# Patient Record
Sex: Female | Born: 1985 | Race: White | Hispanic: No | Marital: Married | Smoking: Former smoker
Health system: Southern US, Community
[De-identification: ages and names within clinical notes are randomized; demographics above are authoritative.]

## PROBLEM LIST (undated history)

## (undated) DIAGNOSIS — R7303 Prediabetes: Secondary | ICD-10-CM

## (undated) DIAGNOSIS — F419 Anxiety disorder, unspecified: Secondary | ICD-10-CM

## (undated) DIAGNOSIS — D649 Anemia, unspecified: Secondary | ICD-10-CM

## (undated) DIAGNOSIS — A749 Chlamydial infection, unspecified: Secondary | ICD-10-CM

## (undated) DIAGNOSIS — F329 Major depressive disorder, single episode, unspecified: Secondary | ICD-10-CM

## (undated) DIAGNOSIS — B977 Papillomavirus as the cause of diseases classified elsewhere: Secondary | ICD-10-CM

## (undated) DIAGNOSIS — R51 Headache: Secondary | ICD-10-CM

## (undated) DIAGNOSIS — F32A Depression, unspecified: Secondary | ICD-10-CM

## (undated) HISTORY — PX: OTHER SURGICAL HISTORY: SHX169

## (undated) HISTORY — DX: Anxiety disorder, unspecified: F41.9

## (undated) HISTORY — DX: Prediabetes: R73.03

## (undated) HISTORY — PX: NO PAST SURGERIES: SHX2092

---

## 2006-05-31 ENCOUNTER — Other Ambulatory Visit: Admission: RE | Admit: 2006-05-31 | Discharge: 2006-05-31 | Payer: Self-pay | Admitting: Obstetrics and Gynecology

## 2006-09-24 ENCOUNTER — Inpatient Hospital Stay (HOSPITAL_COMMUNITY): Admission: AD | Admit: 2006-09-24 | Discharge: 2006-09-25 | Payer: Self-pay | Admitting: Obstetrics and Gynecology

## 2006-09-28 ENCOUNTER — Inpatient Hospital Stay (HOSPITAL_COMMUNITY): Admission: AD | Admit: 2006-09-28 | Discharge: 2006-09-29 | Payer: Self-pay | Admitting: Obstetrics and Gynecology

## 2006-10-25 ENCOUNTER — Inpatient Hospital Stay (HOSPITAL_COMMUNITY): Admission: AD | Admit: 2006-10-25 | Discharge: 2006-10-26 | Payer: Self-pay | Admitting: Obstetrics and Gynecology

## 2006-10-29 ENCOUNTER — Ambulatory Visit: Payer: Self-pay | Admitting: Cardiology

## 2006-11-06 ENCOUNTER — Inpatient Hospital Stay (HOSPITAL_COMMUNITY): Admission: AD | Admit: 2006-11-06 | Discharge: 2006-11-06 | Payer: Self-pay | Admitting: Obstetrics and Gynecology

## 2006-11-15 ENCOUNTER — Inpatient Hospital Stay (HOSPITAL_COMMUNITY): Admission: AD | Admit: 2006-11-15 | Discharge: 2006-11-17 | Payer: Self-pay | Admitting: Obstetrics and Gynecology

## 2006-11-26 ENCOUNTER — Ambulatory Visit: Admission: RE | Admit: 2006-11-26 | Discharge: 2006-11-26 | Payer: Self-pay | Admitting: Obstetrics and Gynecology

## 2006-11-30 ENCOUNTER — Ambulatory Visit: Admission: RE | Admit: 2006-11-30 | Discharge: 2006-11-30 | Payer: Self-pay | Admitting: Obstetrics and Gynecology

## 2008-01-15 ENCOUNTER — Ambulatory Visit: Payer: Self-pay | Admitting: Cardiology

## 2008-01-17 ENCOUNTER — Ambulatory Visit: Payer: Self-pay | Admitting: Cardiovascular Disease

## 2008-01-17 ENCOUNTER — Ambulatory Visit: Payer: Self-pay | Admitting: Cardiology

## 2008-02-09 IMAGING — US US OB COMP +14 WK
1 series · 13 of 27 positions shown · non-contrast
Comparison: none

CLINICAL DATA: 20-year-old female with 32 week 1 day gestation.  Preterm labor.  Evaluate fluid, growth, and cervical length.

[Series 1: us ob comp +14 wk · 0.30mm/px · 13 of 27 slices shown]
[im 2/27]
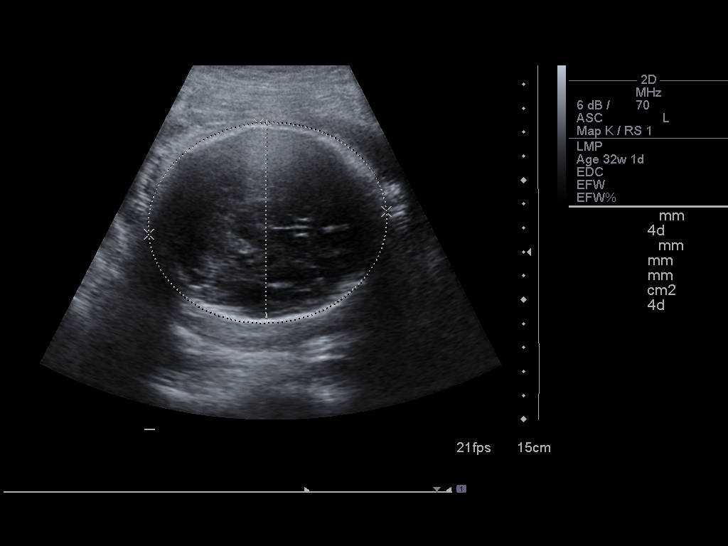
[im 4/27]
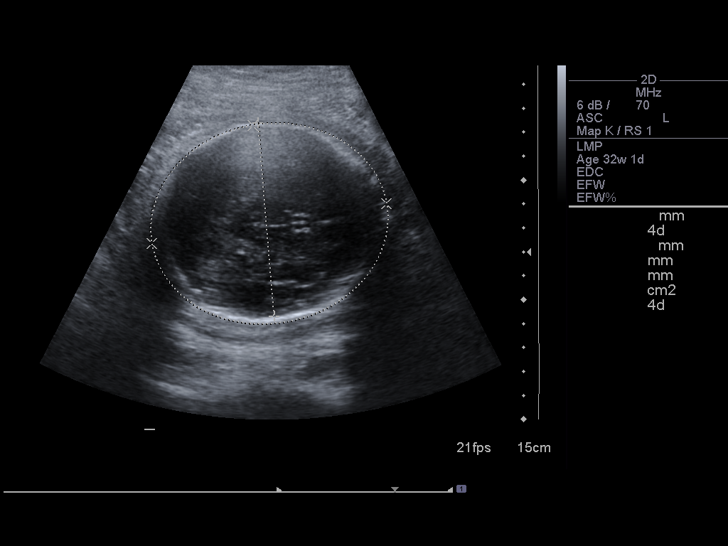
[im 6/27]
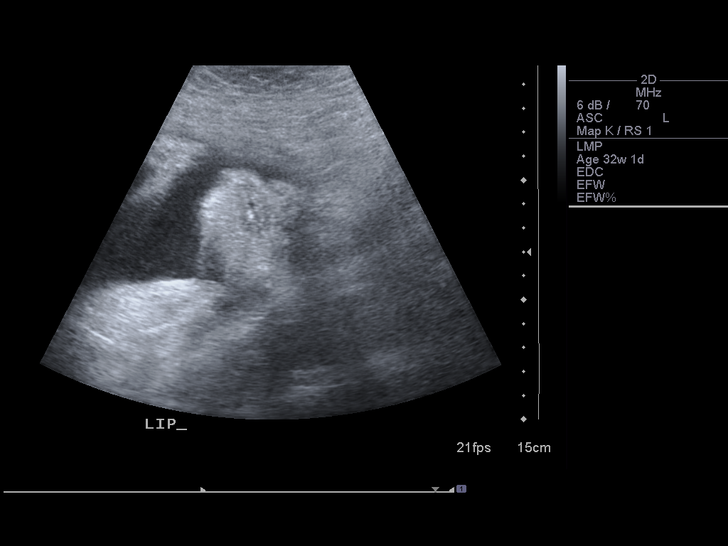
[im 8/27]
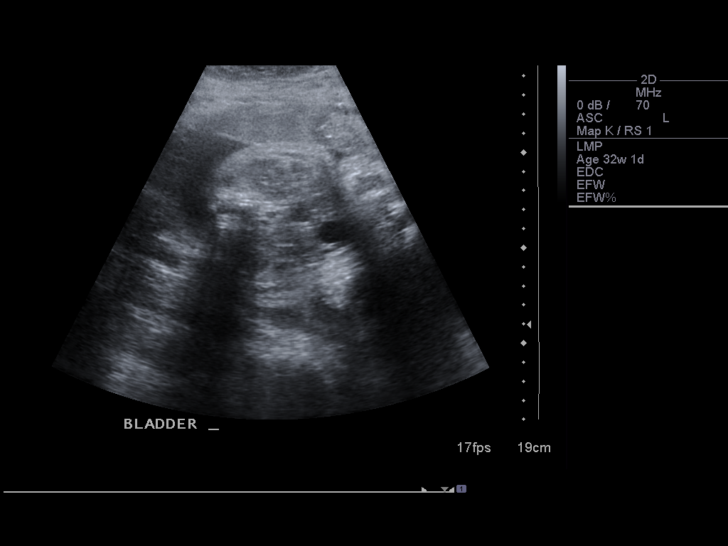
[im 10/27]
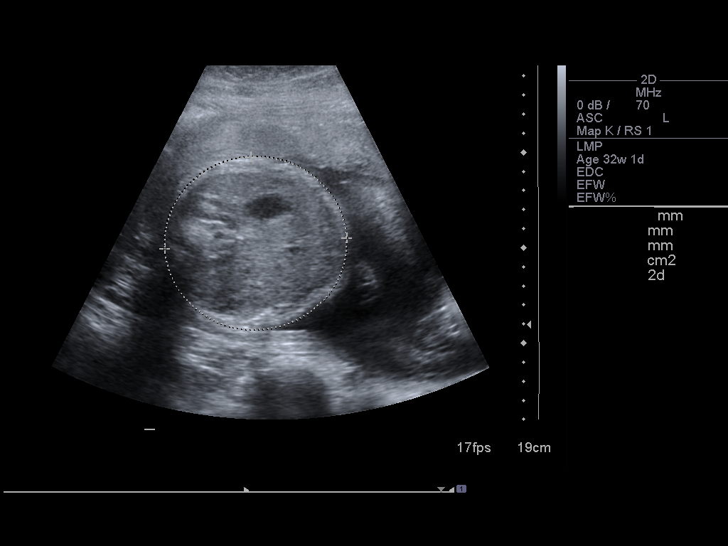
[im 12/27]
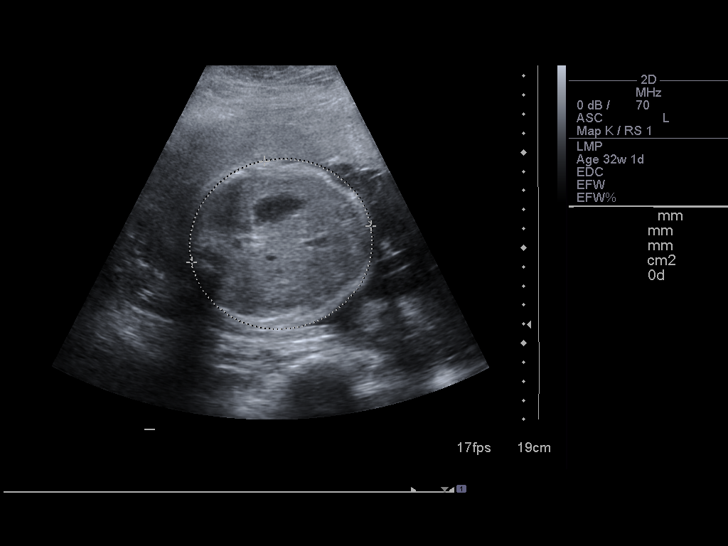
[im 14/27]
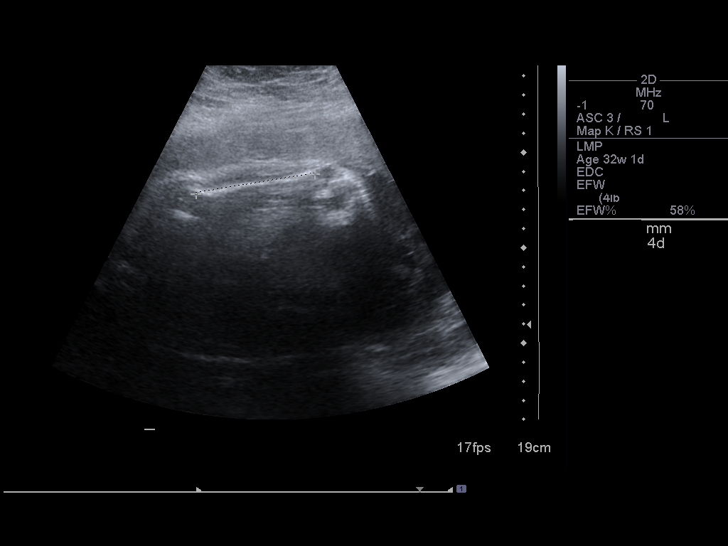
[im 16/27]
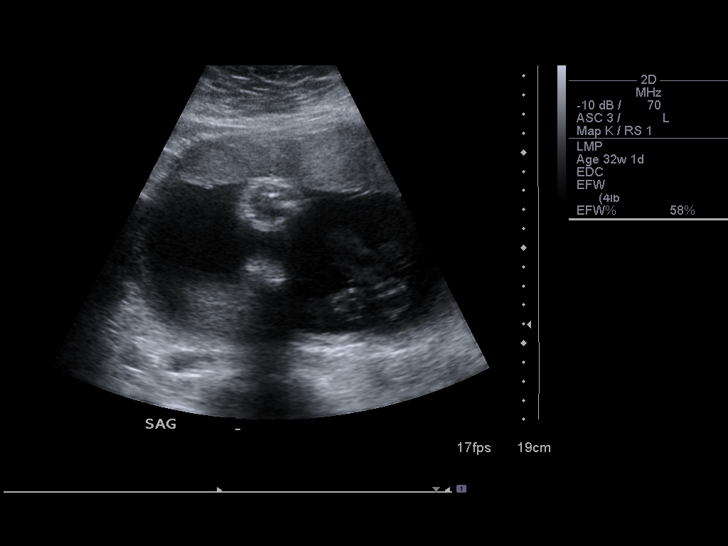
[im 18/27]
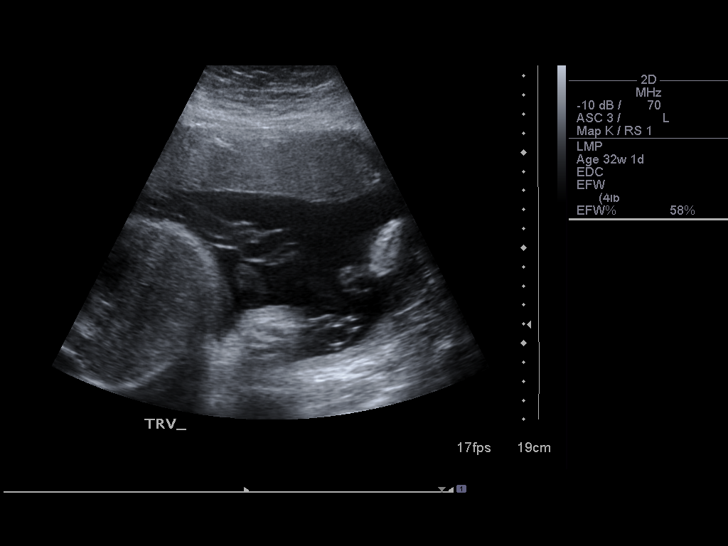
[im 20/27]
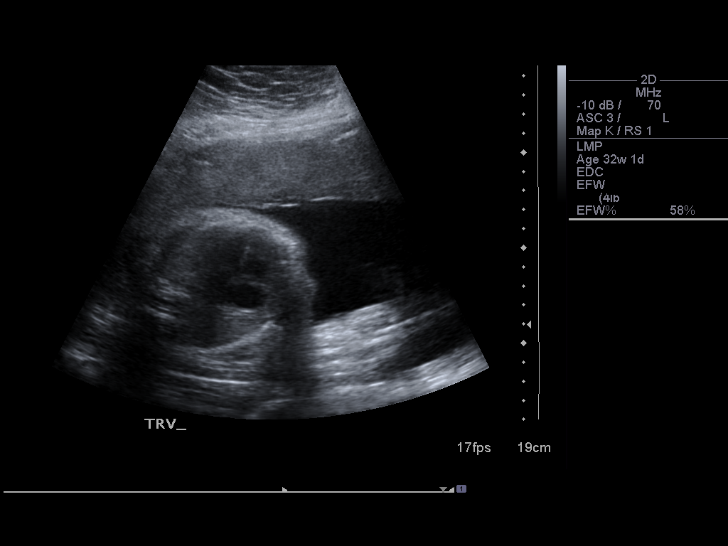
[im 22/27]
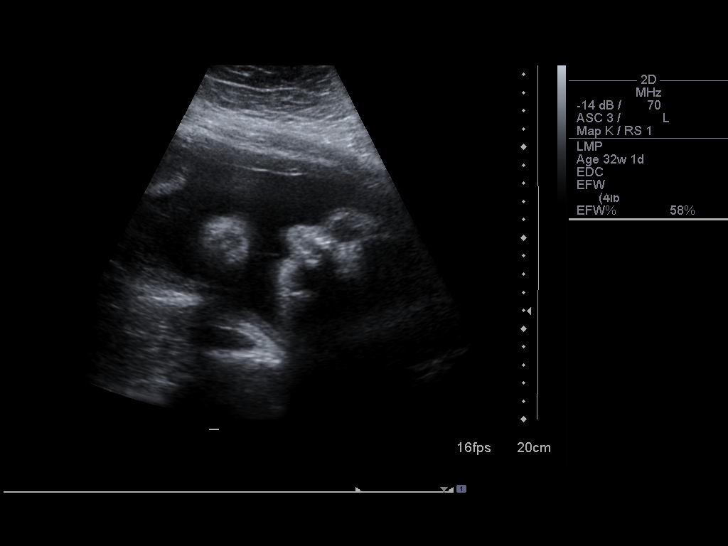
[im 24/27]
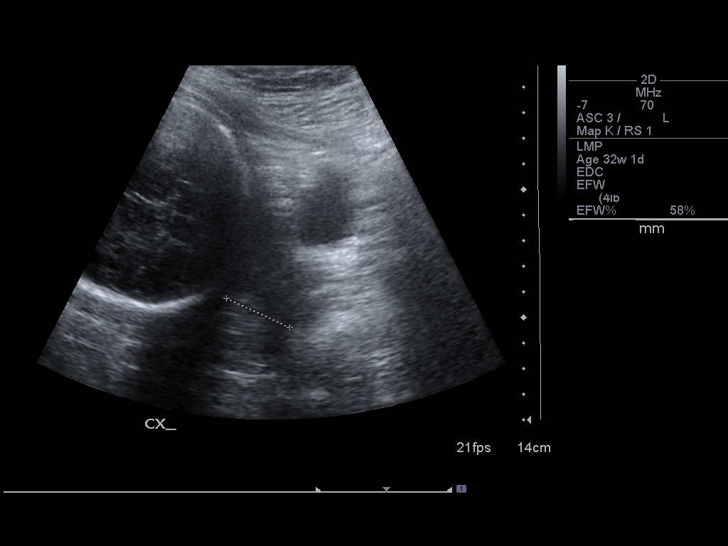
[im 26/27]
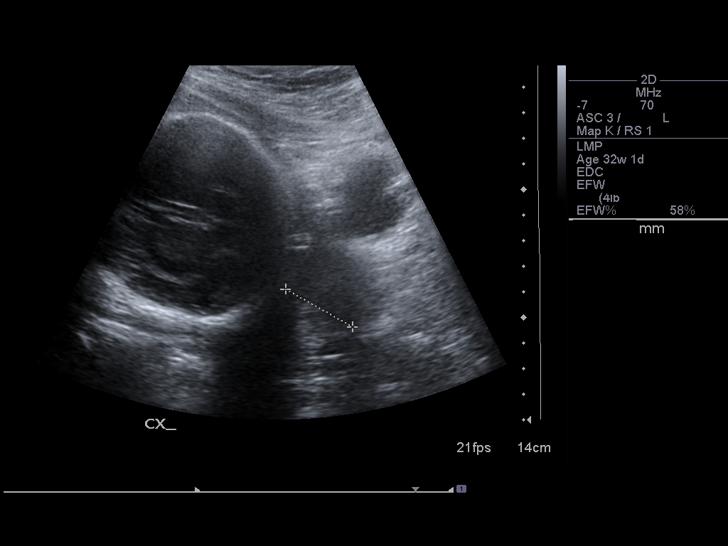

[13 of 27 positions shown; findings below may reference images not displayed]

OBSTETRICAL ULTRASOUND:
 Number of Fetuses:  1
 Heart Rate:  147 bpm
 Movement:  Yes
 Breathing:  Yes
 Presentation:  Cephalic
 Placental Location:  Anterior
 Grade:  I
 Previa:  No
 Amniotic Fluid (Subjective):  Upper limits-normal
 Amniotic Fluid (Objective):  AFI 25.0 cm (8th-38th %ile = 8.6 to 24.2 cm for 32 weeks) 

 FETAL BIOMETRY
 BPD:  8.1 cm   32 w 4 d 
 HC:  28.8 cm   31 w 4 d 
 AC:  28.9 cm  33 w 0 d 
 FL:  6.3 cm   32 w 5 d 

 MEAN GA:  32 w 3 d   US EDC:  11/16/06
 Assigned GA:  32 w 1 d   Assigned EDC:  11/18/06

 EFW:  1341 grams 50th ? 75th %ile (8486 ? 4080 g) for 32 weeks 

 FETAL ANATOMY
 Lateral Ventricles:  Visualized 
 Thalami/CSP:  Visualized 
 Posterior Fossa:  Not visualized 
 Nuchal Region:  Not visualized 
 Spine:  Not visualized 
 4 Chamber Heart on Left:  Visualized 
 Stomach on Left:  Visualized 
 3 Vessel Cord:  Not visualized 
 Cord Insertion site:  Not visualized 
 Kidneys:  Visualized 
 Bladder:  Visualized 
 Extremities:  Not visualized 

 ADDITIONAL ANATOMY VISUALIZED:  Upper lip and orbits.  

 Evaluation limited by:  Maternal habitus, fetal position, and advanced age.

 MATERNAL UTERINE AND ADNEXAL FINDINGS
 Cervix:  2.7-3.0 cm transabdominal.
IMPRESSION: 1.  Single living intrauterine gestation in cephalic presentation with assigned gestational age of 32 weeks 1 day.  Estimated gestational age by this ultrasound is 32 weeks 3 days.  
 2.  Upper limits of normal amniotic fluid volume.
 3.  Cervical length 2.7-3.0 cm.

## 2009-06-03 IMAGING — CT CT ANGIO CHEST
2 of 5 series · 19 of 46 positions shown · IV contrast (Omnipaque 300)
Comparison: 10/25/2006

Addendum Begins

Clinical history should read:  "Chest pain and shortness of breath"
Impression should read:  "No acute findings."
Addendum Ends
CLINICAL DATA: Chest pain and also be.
CT ANGIOGRAPHY CHEST
TECHNIQUE: Multidetector CT imaging of the chest using the
standard protocol during bolus administration of intravenous
contrast. Multiplanar reconstructed images obtained and reviewed to
evaluate the vascular anatomy.
Contrast: 80 ml of omni 300

[Series 6: pe_xxl 1.0 b20f st · axial · 0.80mm/px · z∈[-275,-20]mm · 16 of 287 slices shown]
[im 16/287  lung]
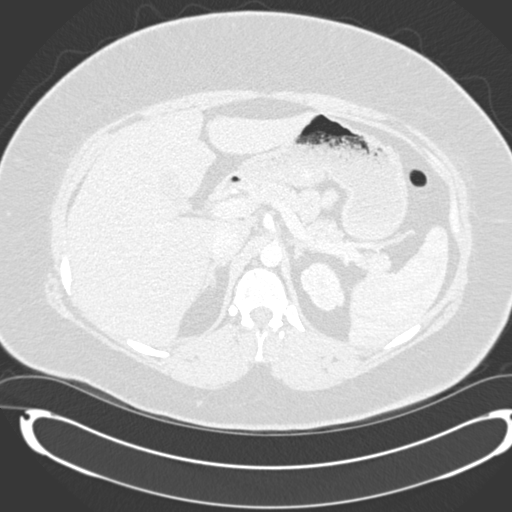
[im 32/287  soft-tissue]
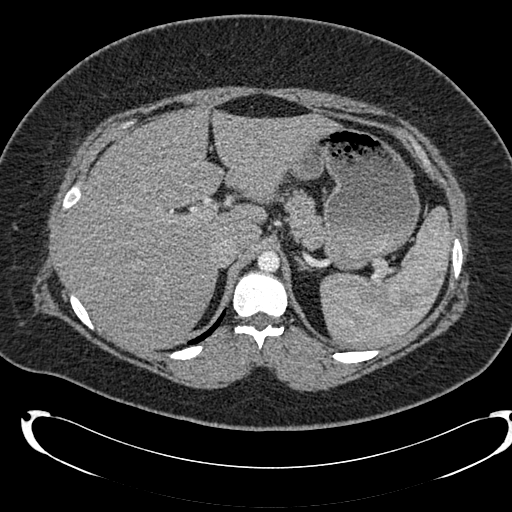
[im 48/287  lung]
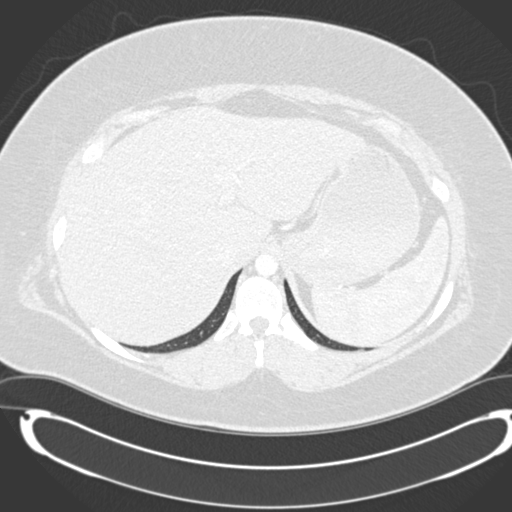
[im 64/287  soft-tissue]
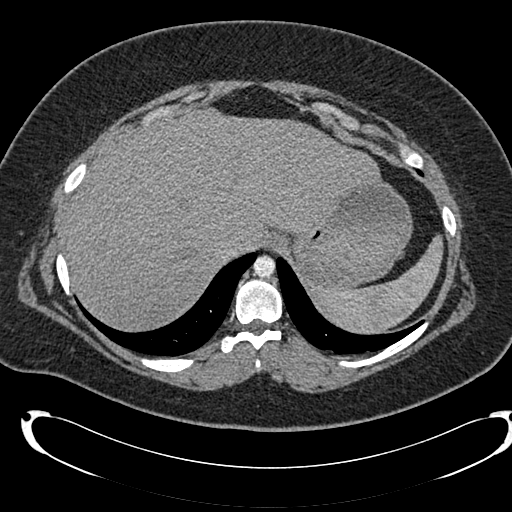
[im 80/287  lung]
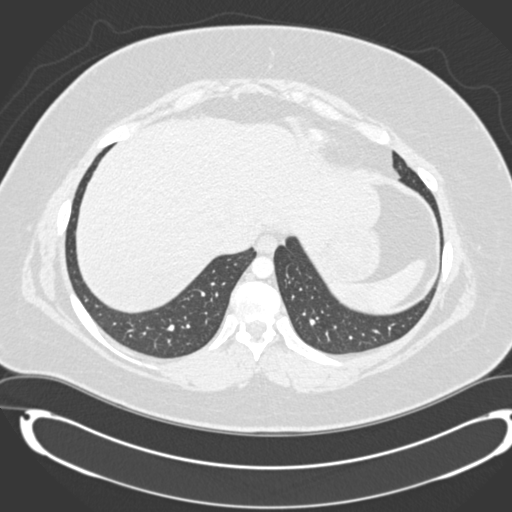
[im 96/287  soft-tissue]
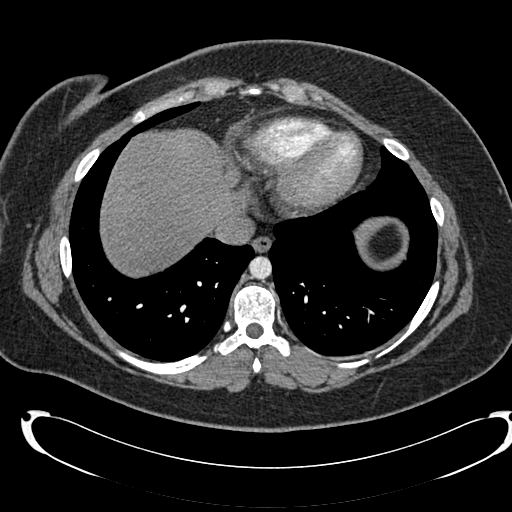
[im 112/287  lung]
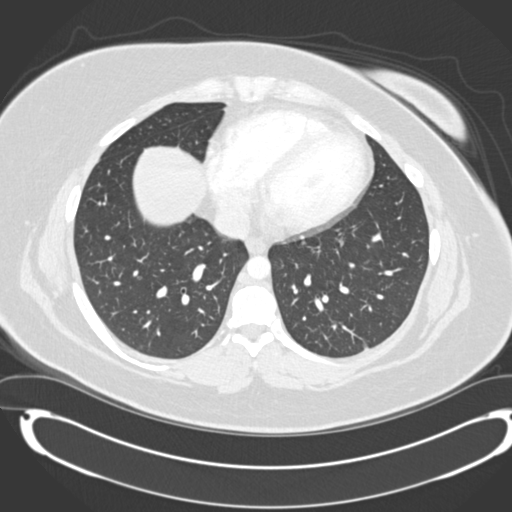
[im 128/287  soft-tissue]
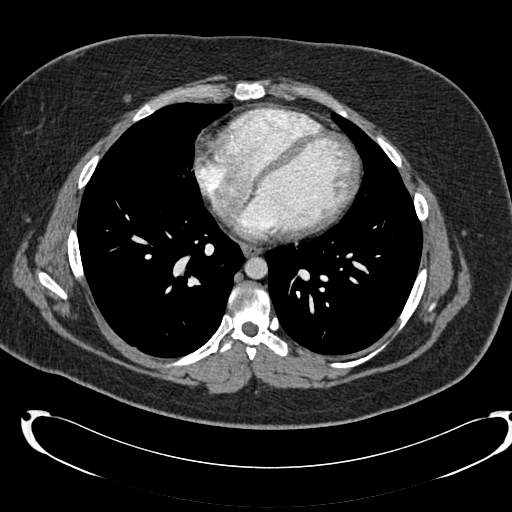
[im 159/287  lung]
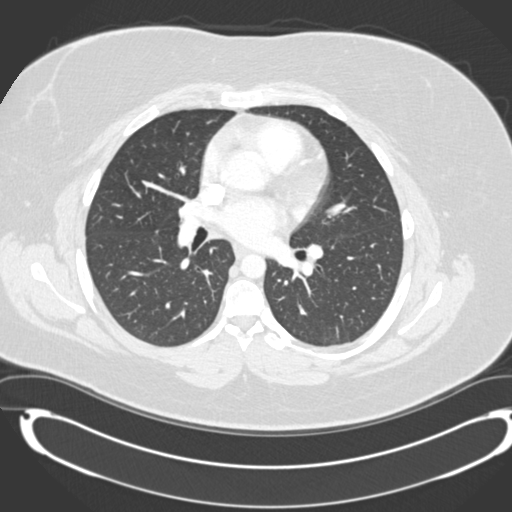
[im 175/287  soft-tissue]
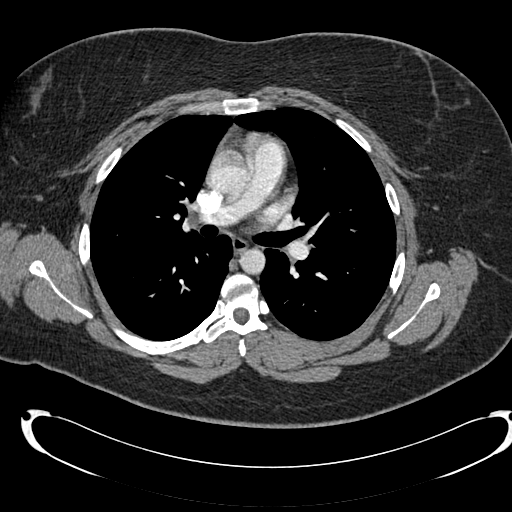
[im 191/287  lung]
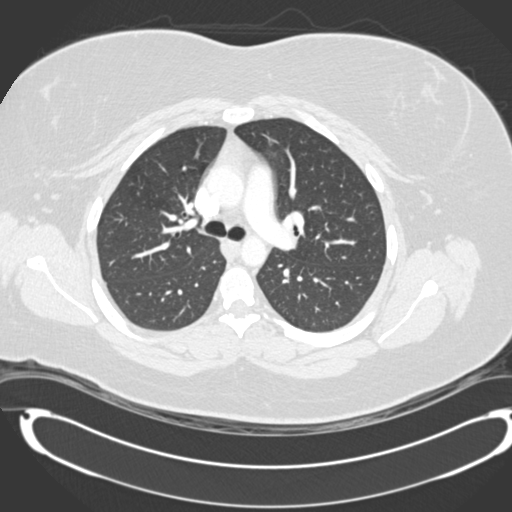
[im 207/287  soft-tissue]
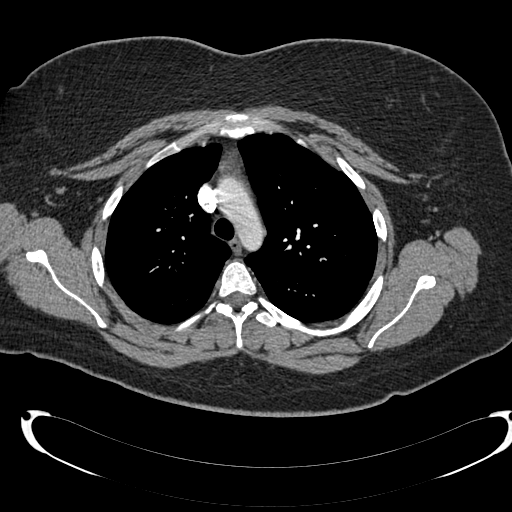
[im 223/287  lung]
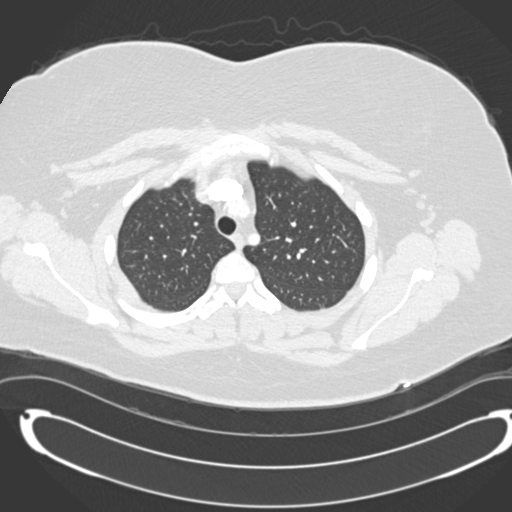
[im 239/287  soft-tissue]
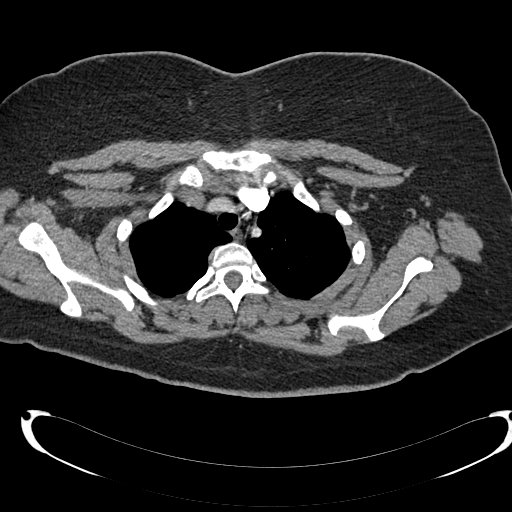
[im 255/287  lung]
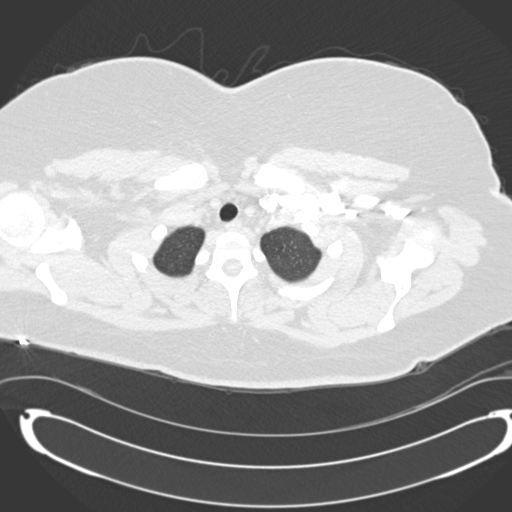
[im 271/287  soft-tissue]
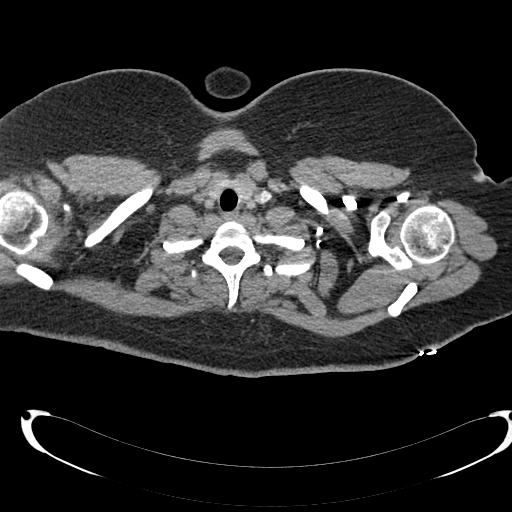

[Series 602: <mpr thick range> · coronal · 0.80mm/px · 3 of 136 slices shown]
[im 46/136  soft-tissue]
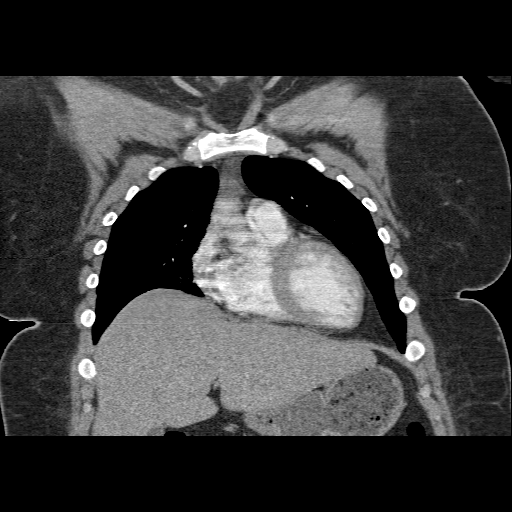
[im 61/136  soft-tissue]
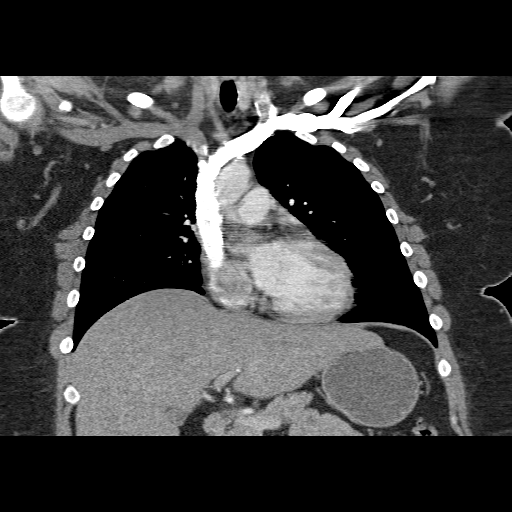
[im 76/136  soft-tissue]
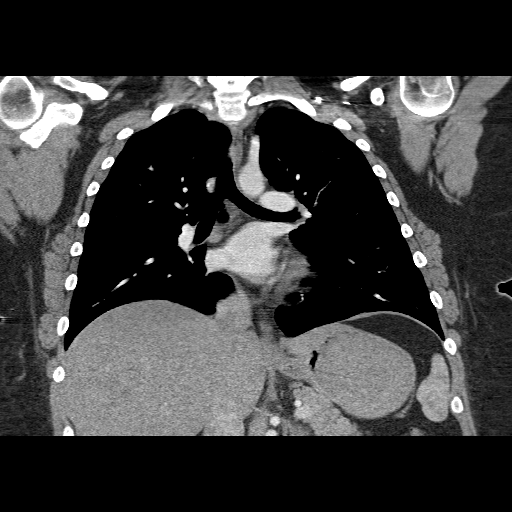

[19 of 46 positions shown; findings below may reference images not displayed]

FINDINGS: There are no enlarged axillary lymph nodes.

No enlarged supraclavicular lymph nodes.

There is no enlarged mediastinal or hilar lymph nodes.

No pericardial or pleural fluid is noted.

There is no abnormal filling defect within the main pulmonary
artery or its branches to suggest an acute pulmonary embolus.

The thoracic aorta is normal in course and caliber.

There is no evidence for a dissection.

The lungs are clear.

No bone lesions are identified.

No acute upper abdominal CT findings are noted.
IMPRESSION: 1.  No acute upper abdominal CT findings.

## 2010-05-03 ENCOUNTER — Emergency Department (HOSPITAL_COMMUNITY): Admission: EM | Admit: 2010-05-03 | Discharge: 2010-05-03 | Payer: Self-pay | Admitting: Emergency Medicine

## 2010-08-28 NOTE — L&D Delivery Note (Signed)
Delivery Note  Arrived in room, pt was c/o increased pain and urge to push. VE, vertex was at -1 and a rim of cervix remained, pt pushed with next ctx and vertex descended and cervix reduced easily. FHR BL was 100 with good variability, pt continued to push well, difficulty tracing FHR as vertex descended further, ext cardio applied but not tracing well, secondary to maternal habitus. Vertex begin to crown and head delivered and there was a tight nuchal cord that was doubly clamped and cut before shoulders delivered, rest of baby delivered easily, initial spontaneous respirations, but then had weak breathing effort, infant was taken to warmer and code apgar called.   At 5:31 PM a viable female was delivered via  (Presentation: ROA ;  ).  Apgars and weight pending at the time of this note.    Cord pH: pending Routine cord blood collected and venous PH, unable to collect arterial PH  NICU staff in room, infant taken to [facility] for further observation  Anesthesia:  epidural Episiotomy: n/a Lacerations: none Suture Repair: n/a Est. Blood Loss (mL): 300  Mom to postpartum.  Baby to nursery-stable  Dr. Su Hilt notified.  [name] [name] 05/23/2011, 5:53 PM

## 2010-11-10 LAB — STREP A DNA PROBE: Group A Strep Probe: NEGATIVE

## 2010-11-10 LAB — RAPID STREP SCREEN (MED CTR MEBANE ONLY): Streptococcus, Group A Screen (Direct): NEGATIVE

## 2011-01-10 NOTE — Assessment & Plan Note (Signed)
[facility]                            CARDIOLOGY OFFICE NOTE   NAME:[name] [name], [name] [name]                      MRN:          [id number]  DATE:01/15/2008                            DOB:          13-Apr-1986    REFERRING PHYSICIAN:  [facility]   PRIMARY CARDIOLOGIST:  Dr. Juanito Doom.   HISTORY OF PRESENT ILLNESS:  [name] [name] is a 25 year old married white  female patient that Dr. Daleen Squibb saw one year ago for sinus tachycardia when  she was [redacted] weeks pregnant.  She also had some mild anemia,  deconditioning and obesity.  He gave her reassurance and said he would  see her back p.r.n.   Since her delivery last year, has had ongoing episodes of palpitations  as well as some associated chest tightness and shortness of breath.  It  happens when she is sitting down feeding her baby and goes away within  one minute.  She also has episodes of sharp shooting chest pains at  times.  She is a diabetic, takes birth control pills, and smokes a  couple cigarettes a day.  Her grandfather had bypass surgery and she has  an uncle who had an MI, but her mother has a history of palpitations  that were attributed to stress, and her father had a pulmonary embolus.  She does not have hypertension.  She does not know her lipid profile and  does not drink excessive caffeine.  She is quite inactive and has  regained all her weight back that she had lost, and she is up to 296.  She denies any exertional symptoms and says overall the symptoms are  doing a little bit better than they had been in the past.   CURRENT MEDICATIONS:  1. Metformin 500 mg daily.  2. Birth control pills daily.   PHYSICAL EXAMINATION:  GENERAL:  This is an obese 25 year old white  female in no acute distress.  VITAL SIGNS:  Blood pressure 117/72, pulse 100, weight 296.  NECK:  Without JVD, HJR, bruit or thyroid enlargement.  LUNGS:  Clear anterior, posterior and lateral.  HEART:  Regular rate and  rhythm at 100 beats per minute, normal S1-S2.  No murmur, rub, bruit, thrill or heave noted.  ABDOMEN:  Soft without organomegaly, masses, lesions or abnormal  tenderness.  EXTREMITIES:  Without cyanosis, clubbing or edema.  She has good distal  pulses.   IMPRESSION:  1. Palpitations with episodic chest pain.  2. Diabetes mellitus.  3. Smoker.  4. Birth control pills  5. Grandfather and uncle with coronary disease.   PLAN:  At this time, we will order an event recorder to rule out any  arrhythmias with her baseline sinus tachycardia.  Will also order a CT  of her chest in light of her being on birth control pills and smoking.  I do not expect either of these to come back positive.  She does need to  lose weight and quit smoking, which I have counseled her on, and she  will see Dr. Daleen Squibb back in follow-up.      Elon Jester  Geni Bers, PA-C  Electronically Signed      Madolyn Frieze. Jens Som, MD, [facility]  Electronically Signed   ML/MedQ  DD: 01/15/2008  DT: 01/15/2008  Job #: [id number]   cc:   Thomas C. Daleen Squibb, MD, [facility]

## 2011-01-13 NOTE — H&P (Signed)
NAME:  [name] [name], [name] [name]               ACCOUNT NO.:  [id number]   MEDICAL RECORD NO.:  [id number]          PATIENT TYPE:  MAT   LOCATION:  [facility]C                          FACILITY:  [facility]   PHYSICIAN:  Janine Limbo, M.D.DATE OF BIRTH:  1985/11/13   DATE OF ADMISSION:  09/24/2006  DATE OF DISCHARGE:                              HISTORY & PHYSICAL   [name] [name] is a 25 year old, gravida 1, para 0, at 32-1/7 weeks who  presented from the office with uterine contractions every 4 minutes on  the monitor.  Cervix was 1 cm, 50% in the office. She received 2 doses  of terbutaline maternity admissions unit with persistent uterine  contractions. Cervix was reevaluated in maternity admissions, and the  patient was found to be 1-2, 50%, with presenting part high. She was  then admitted to antenatal for magnesium sulfate therapy, betamethasone  course, and ultrasound.   Pregnancy has been remarkable for:  1. History of irregular cycles.  2. History of polycystic ovary syndrome.  3. Questionable last menstrual period.  4. Obesity.  5. Equivocal rubella.   PRENATAL LABORATORY DATA:  Blood type O positive, RH antibody negative.  VDRL nonreactive. Rubella titer is equivocal.  Hepatitis B surface  antigen negative.  HIV is nonreactive. Cystic fibrosis testing is  negative.  Pap is normal.  GC and chlamydia cultures are negative.  Hemoglobin upon entry into practice was 12.2; at 28 weeks, it was 10.5.  EDC of November 18, 2006, was established by 15-week ultrasound secondary  to questionable LMP.   HISTORY OF PRESENT PREGNANCY:  The patient entered care at approximately  15 weeks.  She had Glucola at 19 weeks secondary to history of  polycystic ovary syndrome.  This was normal.  She had an ultrasound at  19 weeks showing normal growth and development.  She had another  ultrasound at 24 weeks secondary to decreased fetal movement.  There was  much fetal movement noted.  She had a  Glucola at 28 weeks  that was  elevated at 168.  Three-hour GCT was normal.  Hemoglobin at 28 weeks was  10.5. She was recommended to be placed on iron.  Group B strep, GC and  chlamydia cultures and fetal fibronectin were done on January 21 and  were all negative.   OBSTETRICAL HISTORY:  The patient is a primigravida.   PAST MEDICAL HISTORY:  1. History of polycystic ovary syndrome.  2. History of asthma.  3. She was on metformin prior to pregnancy.   ALLERGIES:  No known drug allergies.   FAMILY HISTORY:  Her mother has multiple sclerosis.  Her father has  depression.  Her father had diabetes.  Her mother has thyroid disease.   GENETIC HISTORY:  Unremarkable.   SOCIAL HISTORY:  The patient is married to the father of the baby.  He  is involved and supportive.  His name is Dorene Bruni.  The patient is  high school educated. She is employed in Engineering geologist.  Her husband has 7  years of college.  He is a Curator. She has been followed by the  certified nurse midwife service at [facility].  She  denies any alcohol, drug, or tobacco use during this pregnancy.  She is  Caucasian and of the [address] faith.   PHYSICAL EXAMINATION:  VITAL SIGNS: Stable.  The patient is afebrile.  HEENT: Within normal limits.  LUNGS:  Bilateral breath sounds are clear.  HEART:  Regular rate and rhythm without murmur.  BREASTS: Soft and nontender.  ABDOMEN:  Fundal height approximately 33 cm.  Uterine contractions every  2-4 minutes.  PELVIC:  Cervix reevaluated in maternity admissions and was found to be  1-2, 50%, with the presenting part high.  EXTREMITIES: Deep tendon reflexes 2+ without clonus.  There is a trace  edema noted.   She received terbutaline in maternity admissions with abatement of  uterine contractions, time about 45 minutes.  She then had a resumption  of the pattern.  She received a second dose of subcutaneous terbutaline  with minimal effect.   IMPRESSION:  1. Intrauterine pregnancy  at 32-1/7 weeks.  2. Preterm labor.   PLAN:  1. Admit to antenatal unit for consult with Dr. Stefano Gaul as attending      physician.  2. Magnesium sulfate 4 g bolus, the 2 g per hour.  3. Betamethasone series with 12.5 mg IM q. 12 h x2 doses.  4. Complete OB ultrasound.  5. MD will follow.      [name] [name], C.N.M.      Janine Limbo, M.D.  Electronically Signed    VLL/MEDQ  D:  09/24/2006  T:  09/24/2006  Job:  [id number]

## 2011-01-13 NOTE — Assessment & Plan Note (Signed)
[facility]                            CARDIOLOGY OFFICE NOTE   NAME:[name] [name], [name] [name]                      MRN:          [id number]  DATE:10/29/2006                            DOB:          May 05, 1986    I was asked by the emergency room at [facility] and also  [facility] to evaluate [name] [name], a very pleasant 25-year-  old married white female who is [redacted] weeks pregnant.   Over the last few weeks she has noted progressive increase in heart  rate. On the 28th, she was having some chest tightness and shortness of  breath. She said she became very anxious.   She went to the emergency room and had a resting heart rate of 124 beats  per minute. EKG which I have a copy of today was unremarkable, except  for nonspecific T-wave changes which I think is a computer over read.  Her D-dimer was mildly positive 0.98. She had a CT scan which was  technically inadequate but did not show any large pulmonary embolus. She  had a normal sized heart, no paracardial, or pleural effusions. Lungs  were clear. There was enlargement of the thymus gland probably related  to pregnancy, or hyperplasia.   Her CBC showed hemoglobin at 11.2, her white count was mildly elevated  at 14.8, electrolytes were normal, her blood sugar 4 hours after eating  was 124, albumin was low at 2.4. Her urinalysis was unremarkable.   She has been at bedrest for several weeks now. She is very  deconditioned. She was obese prior to pregnancy weighing close to 200  pounds. She is now 290.   She denies any orthopnea, PND, just has a lot of abdominal swelling and  some mild swelling in her legs.   PAST MEDICAL HISTORY:  She does not smoke. She quit smoking 8 months  ago. She does not use recreational drugs. She does not drink alcohol.   She does sleep with her head elevated because of the abdominal fullness.   She has had no previous surgeries.   CURRENT MEDICATIONS:  1. Prenatal vitamins times 2  2. Stool softener.  3. Theosol iron 45 mg times 2.   FAMILY HISTORY:  Really unremarkable for any cardiovascular or cardiac  disease.   SOCIAL HISTORY:  She is married and this is her first child. She is not  working at present.   REVIEW OF SYSTEMS:  All care points reviewed and only positive for the  HPI plus constipation, asthma at a young age which she has outgrown,  menstrual dysfunction, having polycystic ovarian syndrome, and  borderline high blood sugar.   PHYSICAL EXAMINATION:  Her blood pressure is 122/72, her pulse is 115  and regular, she is 5 feet 6 inches, weighs 296 pounds. Skin color is  good, nail beds are pink. Conjunctivae are pink as are mucous membranes.  SKIN: Warm and dry.  She is alert and oriented x3.  NEUROLOGICAL EXAM: Grossly intact.  HEENT: Normocephalic, atraumatic, PERRLA: Extraocular movements intact,  sclera are clear and nonicteric, fascial symmetry is normal,  dentition  is satisfactory. There is no obvious JVD.  NECK: Supple, carotid upstrokes are equal bilaterally without bruits.  Her thyroid was not enlarged.  LUNGS: Clear to auscultation.  HEART: Reveals a very difficult palpation to PMI. There is a normal S1,  S2 without gallop or murmur.  ABDOMINAL EXAM: Gravida. There are good bowel sounds.  EXTREMITIES: Reveal trace to 1+ pretibial edema. Pulses were brisk. Feet  were warm as were hands.  MUSCULOSKELETAL: Intact.  SKIN: Unremarkable.   ASSESSMENT/PLAN:  1. Sinus tachycardia which is appropriate for 37 weeks of pregnancy,      mild anemia, deconditioning, obesity, and anxiety.  2. Obesity.  3. Glucose intolerance not yet with metabolic syndrome.   PLAN:  1. Reassurance.  2. Deliver as scheduled vaginally.  3. After breast feeding, strict diet and aggressive weight loss. I      have talked to her at length about type 2 diabetes and obesity. I      have also talked to her about the hazards of stroke,  coronary      disease, or dialysis down the road.   I will see her back on a p.r.n. basis.     Thomas C. Daleen Squibb, MD, [facility]  Electronically Signed    TCW/MedQ  DD: 10/29/2006  DT: 10/29/2006  Job #: [id number]   cc:   [facility]

## 2011-01-13 NOTE — H&P (Signed)
NAME:  [name] [name], [name] [name]               ACCOUNT NO.:  [id number]   MEDICAL RECORD NO.:  [id number]          PATIENT TYPE:  INP   LOCATION:  9163                          [facility]ACILITY:  [facility]   PHYSICIAN:  Hal Morales, M.D.DATE OF BIRTH:  Aug 02, 1986   DATE OF ADMISSION:  11/15/2006  DATE OF DISCHARGE:                              HISTORY & PHYSICAL   This is a 25 year old gravida 1, para 0, at 39-4/7 weeks, who presents  in labor status post rupture of membranes this morning.  There is  positive fetal movement.  Pregnancy has been followed by the nurse  midwife service and remarkable for:   1. Unsure LMP.  2. Asthma.  3. Obesity.  4. Equivocal rubella.  5. Late to care.  6. Group B strep negative.   ALLERGIES:  None.   OBSTETRICAL HISTORY:  The patient is a primigravida.   MEDICAL HISTORY:  Remarkable for history of PCOS, history of asthma.   SURGICAL HISTORY:  Negative.   FAMILY HISTORY:  Remarkable for mother with MS, father with depression,  father with diabetes and mother with thyroid problems.   GENETIC HISTORY:  Negative.   SOCIAL HISTORY:  The patient is newly married to [employer], who is  involved and supportive.  She is of the [employer].  She denies any  alcohol, tobacco or drug use.   PRENATAL LABORATORY DATA:  Hemoglobin 12.2, platelets 315.  Blood type O  positive, antibody screen negative.  RPR nonreactive.  Rubella  equivocal.  Hepatitis negative.  HIV negative.  Pap test normal.  Gonorrhea negative.  Chlamydia negative.  Cystic fibrosis negative.   HISTORY OF CURRENT PREGNANCY:  The patient entered care at 39 weeks'  gestation.  She had an ultrasound for anatomy at 19 weeks, which was  normal.  She also had a normal Glucola at that time.  She also had  another ultrasound at 24 weeks, which was normal.  She had a Glucola at  28 weeks that was elevated at 168 and she did a 3-hour GTT, which was  normal.  She had some pressure at 31 weeks and had  negative fetal  fibronectin, and she had used some Procardia occasionally for  contractions in the early third trimester and presents now at term with  a negative group B strep.   OBJECTIVE:  VITAL SIGNS:  Stable.  Afebrile.  HEENT:  Within normal limits.  NECK:  Thyroid not enlarged.  CHEST:  Clear to auscultation.  CARDIAC:  Heart rate regular rate and rhythm.  ABDOMEN:  Gravid.  Vertex to Leopold's.  EFM shows nonreactive fetal  heart rate in the 150s with intermittent small decelerations to the  130s.  Uterine contractions are every 3 minutes.  PELVIC:  Cervix is 4 cm, 75% effaced, -2 station.  Vertex in the office.  EXTREMITIES:  Within normal limits.   ASSESSMENT:  1. Intrauterine pregnancy at 39-4/7 weeks.  2. Active labor.  3. Spontaneous rupture of membranes.   PLAN:  1. Admit to birthing suites per Dr. Pennie Rushing.  2. Routine CNM orders.  3. Plan IUPC and  amnioinfusion.  4. Patient may want epidural.      Marie L. Williams, C.N.M.      Hal Morales, M.D.  Electronically Signed    MLW/MEDQ  D:  11/15/2006  T:  11/15/2006  Job:  [id number]

## 2011-03-02 ENCOUNTER — Inpatient Hospital Stay (HOSPITAL_COMMUNITY)
Admission: AD | Admit: 2011-03-02 | Discharge: 2011-03-02 | Disposition: A | Discharge status: Home / Self Care | Payer: Medicaid Other | Source: Ambulatory Visit | Attending: Obstetrics and Gynecology | Admitting: Obstetrics and Gynecology

## 2011-03-02 DIAGNOSIS — O4700 False labor before 37 completed weeks of gestation, unspecified trimester: Secondary | ICD-10-CM | POA: Insufficient documentation

## 2011-03-02 LAB — URINALYSIS, ROUTINE W REFLEX MICROSCOPIC
Protein, ur: NEGATIVE mg/dL
Specific Gravity, Urine: >1.03 — ABNORMAL HIGH (ref 1.005–1.030)
Urobilinogen, UA: 0.2 mg/dL (ref 0.0–1.0)
pH: 6 (ref 5.0–8.0)

## 2011-03-02 LAB — WET PREP, GENITAL: Trich, Wet Prep: NONE SEEN

## 2011-03-02 LAB — FETAL FIBRONECTIN: Fetal Fibronectin: NEGATIVE

## 2011-03-02 LAB — URINE MICROSCOPIC-ADD ON

## 2011-03-03 LAB — URINE CULTURE: Colony Count: >=100000

## 2011-03-04 LAB — STREP B DNA PROBE

## 2011-03-28 ENCOUNTER — Encounter: Payer: Self-pay | Admitting: Cardiology

## 2011-03-28 ENCOUNTER — Ambulatory Visit (INDEPENDENT_AMBULATORY_CARE_PROVIDER_SITE_OTHER): Payer: Medicaid Other | Admitting: Cardiology

## 2011-03-28 DIAGNOSIS — R0609 Other forms of dyspnea: Secondary | ICD-10-CM

## 2011-03-28 DIAGNOSIS — Z8742 Personal history of other diseases of the female genital tract: Secondary | ICD-10-CM

## 2011-03-28 DIAGNOSIS — R079 Chest pain, unspecified: Secondary | ICD-10-CM

## 2011-03-28 DIAGNOSIS — R0789 Other chest pain: Secondary | ICD-10-CM

## 2011-03-28 DIAGNOSIS — IMO0001 Reserved for inherently not codable concepts without codable children: Secondary | ICD-10-CM

## 2011-03-28 DIAGNOSIS — R0600 Dyspnea, unspecified: Secondary | ICD-10-CM

## 2011-03-28 NOTE — Patient Instructions (Signed)
We will schedule you for an Echocardiogram.  We will call you with these results.

## 2011-03-28 NOTE — Assessment & Plan Note (Signed)
I think her symptoms of dyspnea and atypical chest pain are related to her pregnancy. With growth of the fetus she has more lung restriction with elevation of her diaphragm. Her increased heart rate is consistent with increased cardiac output during the third term pregnancy. We will obtain an echocardiogram to rule out significant structural heart disease. If this is normal then we will reassure her and I would anticipate her symptoms will resolve with completion of her pregnancy.

## 2011-03-28 NOTE — Progress Notes (Signed)
   Harold Hedge Date of Birth: 08-10-1986   History of Present Illness: [name] [name] is a pleasant 25 year old white female who is [redacted] weeks pregnant. She is referred by gynecology for evaluation of dyspnea and chest pain. She has been in good health. This is her second pregnancy. Her first pregnancy was complicated by preterm labor. She also reports that she had heart rates up to 140 beats per minute during that pregnancy. With this her second pregnancy she reports that she had some chest pain early in her pregnancy that seemed to respond to antianxiety medications. Over the past 2 weeks however she has noticed more consistent difficulty with shortness of breath and chest pain. The shortness of breath comes and goes. It is associated with a sensation of tightness and pressure in her mid sternum. She denies any significant swelling. She has gained about 13 pounds with this pregnancy. Recently her Zoloft dose was increased from 50-75 mg but she has noticed no improvement. Her symptoms are nonexertional.  No current outpatient prescriptions on file prior to visit.    No Known Allergies  Past Medical History  Diagnosis Date  . Asthma     childhood, cold induced  . Anxiety   . Borderline diabetes     History reviewed. No pertinent past surgical history.  History  Smoking status  . Former Smoker  . Quit date: 08/28/2009  Smokeless tobacco  . Not on file    History  Alcohol Use No    Family History  Problem Relation Age of Onset  . Multiple sclerosis Mother   . Hypertension Mother   . Pulmonary embolism Father   . Hypertension Father   . Diabetes Father     Review of Systems: The review of systems is positive for chronic obesity.  She has no history of hypertension or diabetes.All other systems were reviewed and are negative.  Physical Exam: BP 108/62  Pulse 107  Ht 5\' 6"  (1.676 m)  Wt 308 lb (139.708 kg)  BMI 49.71 kg/m2 She is an obese, gravid white female in no acute  distress. She is normocephalic, atraumatic. Pupils are equal round and reactive to light accommodation. Extraocular movements are. Oropharynx is clear. Neck is supple without JVD, adenopathy, thyromegaly, or bruits. Lungs are clear. Cardiac exam reveals a regular rate and rhythm without gallop, murmur, or click. Abdomen is soft and nontender. She is gravid. She has no edema. Pulses are palpable. She is alert oriented x3. Cranial nerves II through XII are intact. Skin is warm and dry. LABORATORY DATA: ECG demonstrates sinus tachycardia with a rate of 107 beats per minute. It is normal.  Assessment / Plan:

## 2011-03-29 ENCOUNTER — Encounter: Payer: Self-pay | Admitting: Cardiology

## 2011-03-29 ENCOUNTER — Ambulatory Visit (HOSPITAL_COMMUNITY): Payer: Medicaid Other | Attending: Cardiology

## 2011-03-29 DIAGNOSIS — R079 Chest pain, unspecified: Secondary | ICD-10-CM | POA: Insufficient documentation

## 2011-03-29 DIAGNOSIS — F411 Generalized anxiety disorder: Secondary | ICD-10-CM | POA: Insufficient documentation

## 2011-03-29 DIAGNOSIS — R0600 Dyspnea, unspecified: Secondary | ICD-10-CM

## 2011-03-29 DIAGNOSIS — J45909 Unspecified asthma, uncomplicated: Secondary | ICD-10-CM | POA: Insufficient documentation

## 2011-03-29 DIAGNOSIS — R0989 Other specified symptoms and signs involving the circulatory and respiratory systems: Secondary | ICD-10-CM

## 2011-03-29 DIAGNOSIS — R0609 Other forms of dyspnea: Secondary | ICD-10-CM | POA: Insufficient documentation

## 2011-03-29 DIAGNOSIS — Z87891 Personal history of nicotine dependence: Secondary | ICD-10-CM | POA: Insufficient documentation

## 2011-03-29 DIAGNOSIS — R072 Precordial pain: Secondary | ICD-10-CM

## 2011-03-31 ENCOUNTER — Telehealth: Payer: Self-pay | Admitting: *Deleted

## 2011-03-31 NOTE — Telephone Encounter (Signed)
Message copied by Lorayne Bender on Fri Mar 31, 2011  3:42 PM ------      Message from: [place], PETER M      Created: Wed Mar 29, 2011  5:00 PM       Echo is normal. Please report.

## 2011-03-31 NOTE — Telephone Encounter (Signed)
Notified of echo results. Will send to [facility] [place]

## 2011-05-23 ENCOUNTER — Encounter (HOSPITAL_COMMUNITY): Payer: Self-pay | Admitting: *Deleted

## 2011-05-23 ENCOUNTER — Encounter (HOSPITAL_COMMUNITY): Payer: Self-pay | Admitting: Anesthesiology

## 2011-05-23 ENCOUNTER — Inpatient Hospital Stay (HOSPITAL_COMMUNITY): Payer: Medicaid Other | Admitting: Anesthesiology

## 2011-05-23 ENCOUNTER — Inpatient Hospital Stay (HOSPITAL_COMMUNITY)
Admission: AD | Admit: 2011-05-23 | Discharge: 2011-05-25 | DRG: 775 | Disposition: A | Discharge status: Home / Self Care | Payer: Medicaid Other | Source: Ambulatory Visit | Attending: Obstetrics and Gynecology | Admitting: Obstetrics and Gynecology

## 2011-05-23 DIAGNOSIS — IMO0002 Reserved for concepts with insufficient information to code with codable children: Secondary | ICD-10-CM

## 2011-05-23 DIAGNOSIS — R0600 Dyspnea, unspecified: Secondary | ICD-10-CM

## 2011-05-23 DIAGNOSIS — R0789 Other chest pain: Secondary | ICD-10-CM

## 2011-05-23 DIAGNOSIS — IMO0001 Reserved for inherently not codable concepts without codable children: Secondary | ICD-10-CM

## 2011-05-23 DIAGNOSIS — E669 Obesity, unspecified: Secondary | ICD-10-CM | POA: Diagnosis present

## 2011-05-23 DIAGNOSIS — O99214 Obesity complicating childbirth: Secondary | ICD-10-CM | POA: Diagnosis present

## 2011-05-23 DIAGNOSIS — O409XX0 Polyhydramnios, unspecified trimester, not applicable or unspecified: Secondary | ICD-10-CM | POA: Diagnosis present

## 2011-05-23 DIAGNOSIS — F419 Anxiety disorder, unspecified: Secondary | ICD-10-CM | POA: Diagnosis present

## 2011-05-23 DIAGNOSIS — O403XX0 Polyhydramnios, third trimester, not applicable or unspecified: Secondary | ICD-10-CM

## 2011-05-23 DIAGNOSIS — O4290 Premature rupture of membranes, unspecified as to length of time between rupture and onset of labor, unspecified weeks of gestation: Principal | ICD-10-CM | POA: Diagnosis present

## 2011-05-23 HISTORY — DX: Papillomavirus as the cause of diseases classified elsewhere: B97.7

## 2011-05-23 HISTORY — DX: Chlamydial infection, unspecified: A74.9

## 2011-05-23 HISTORY — DX: Headache: R51

## 2011-05-23 LAB — CBC
HCT: 34.7 % — ABNORMAL LOW (ref 36.0–46.0)
Hemoglobin: 11 g/dL — ABNORMAL LOW (ref 12.0–15.0)
WBC: 15.1 10*3/uL — ABNORMAL HIGH (ref 4.0–10.5)

## 2011-05-23 LAB — ABO/RH: RH Type: POSITIVE

## 2011-05-23 LAB — RPR: RPR: NONREACTIVE

## 2011-05-23 MED ORDER — DIPHENHYDRAMINE HCL 50 MG/ML IJ SOLN: 12.5000 mg | INTRAMUSCULAR | Status: DC | PRN

## 2011-05-23 MED ORDER — SIMETHICONE 80 MG PO CHEW: 80.0000 mg | CHEWABLE_TABLET | ORAL | Status: DC | PRN

## 2011-05-23 MED ORDER — ONDANSETRON HCL 4 MG/2ML IJ SOLN: 4.0000 mg | Freq: Four times a day (QID) | INTRAMUSCULAR | Status: DC | PRN

## 2011-05-23 MED ORDER — MAGNESIUM HYDROXIDE 400 MG/5ML PO SUSP: 30.0000 mL | ORAL | Status: DC | PRN

## 2011-05-23 MED ORDER — DIPHENHYDRAMINE HCL 25 MG PO CAPS: 25.0000 mg | ORAL_CAPSULE | Freq: Four times a day (QID) | ORAL | Status: DC | PRN

## 2011-05-23 MED ORDER — HYDROXYZINE HCL 50 MG/ML IM SOLN
50.0000 mg | Freq: Four times a day (QID) | INTRAMUSCULAR | Status: DC | PRN
  Filled 2011-05-23: qty 1

## 2011-05-23 MED ORDER — HYDROXYZINE HCL 50 MG PO TABS
50.0000 mg | ORAL_TABLET | Freq: Four times a day (QID) | ORAL | Status: DC | PRN
  Filled 2011-05-23: qty 1

## 2011-05-23 MED ORDER — WITCH HAZEL-GLYCERIN EX PADS: 1.0000 "application " | MEDICATED_PAD | CUTANEOUS | Status: DC | PRN

## 2011-05-23 MED ORDER — FENTANYL 2.5 MCG/ML BUPIVACAINE 1/10 % EPIDURAL INFUSION (WH - ANES)
14.0000 mL/h | INTRAMUSCULAR | Status: DC
  Administered 2011-05-23: 14 mL/h via EPIDURAL
  Filled 2011-05-23 (×2): qty 60

## 2011-05-23 MED ORDER — IBUPROFEN 600 MG PO TABS
600.0000 mg | ORAL_TABLET | Freq: Four times a day (QID) | ORAL | Status: DC
  Administered 2011-05-24 – 2011-05-25 (×6): 600 mg via ORAL
  Filled 2011-05-23 (×6): qty 1

## 2011-05-23 MED ORDER — SERTRALINE HCL 100 MG PO TABS
100.0000 mg | ORAL_TABLET | Freq: Every day | ORAL | Status: DC
  Administered 2011-05-23 – 2011-05-24 (×2): 100 mg via ORAL
  Filled 2011-05-23 (×2): qty 1

## 2011-05-23 MED ORDER — SENNOSIDES-DOCUSATE SODIUM 8.6-50 MG PO TABS
2.0000 | ORAL_TABLET | Freq: Every day | ORAL | Status: DC
  Administered 2011-05-23: 2 via ORAL

## 2011-05-23 MED ORDER — ONDANSETRON HCL 4 MG/2ML IJ SOLN: 4.0000 mg | INTRAMUSCULAR | Status: DC | PRN

## 2011-05-23 MED ORDER — TERBUTALINE SULFATE 1 MG/ML IJ SOLN: 0.2500 mg | Freq: Once | INTRAMUSCULAR | Status: DC | PRN

## 2011-05-23 MED ORDER — BUTORPHANOL TARTRATE 2 MG/ML IJ SOLN: 1.0000 mg | INTRAMUSCULAR | Status: DC | PRN

## 2011-05-23 MED ORDER — EPHEDRINE 5 MG/ML INJ
10.0000 mg | INTRAVENOUS | Status: DC | PRN
  Filled 2011-05-23: qty 4

## 2011-05-23 MED ORDER — PRENATAL PLUS 27-1 MG PO TABS
1.0000 | ORAL_TABLET | Freq: Every day | ORAL | Status: DC
  Administered 2011-05-24 – 2011-05-25 (×2): 1 via ORAL
  Filled 2011-05-23 (×2): qty 1

## 2011-05-23 MED ORDER — BENZOCAINE-MENTHOL 20-0.5 % EX AERO
1.0000 | INHALATION_SPRAY | CUTANEOUS | Status: DC | PRN
Start: 2011-05-23 — End: 2011-05-25

## 2011-05-23 MED ORDER — LANOLIN HYDROUS EX OINT: TOPICAL_OINTMENT | CUTANEOUS | Status: DC | PRN

## 2011-05-23 MED ORDER — LACTATED RINGERS IV SOLN
INTRAVENOUS | Status: DC
  Administered 2011-05-23: 14:00:00 via INTRAVENOUS

## 2011-05-23 MED ORDER — DIBUCAINE 1 % RE OINT: 1.0000 "application " | TOPICAL_OINTMENT | RECTAL | Status: DC | PRN

## 2011-05-23 MED ORDER — OXYCODONE-ACETAMINOPHEN 5-325 MG PO TABS
1.0000 | ORAL_TABLET | ORAL | Status: DC | PRN
  Filled 2011-05-23: qty 1

## 2011-05-23 MED ORDER — SODIUM CHLORIDE 0.9 % IJ SOLN: 3.0000 mL | INTRAMUSCULAR | Status: DC | PRN

## 2011-05-23 MED ORDER — MEDROXYPROGESTERONE ACETATE 150 MG/ML IM SUSP: 150.0000 mg | INTRAMUSCULAR | Status: DC | PRN

## 2011-05-23 MED ORDER — LACTATED RINGERS IV SOLN: 500.0000 mL | INTRAVENOUS | Status: DC | PRN

## 2011-05-23 MED ORDER — OXYTOCIN BOLUS FROM INFUSION
500.0000 mL | Freq: Once | INTRAVENOUS | Status: DC
  Filled 2011-05-23: qty 500
  Filled 2011-05-23: qty 1000

## 2011-05-23 MED ORDER — OXYCODONE-ACETAMINOPHEN 5-325 MG PO TABS: 2.0000 | ORAL_TABLET | ORAL | Status: DC | PRN

## 2011-05-23 MED ORDER — OXYTOCIN 20 UNITS IN LACTATED RINGERS INFUSION - SIMPLE
1.0000 m[IU]/min | INTRAVENOUS | Status: DC
  Administered 2011-05-23: 2 m[IU]/min via INTRAVENOUS

## 2011-05-23 MED ORDER — ACETAMINOPHEN 325 MG PO TABS: 650.0000 mg | ORAL_TABLET | ORAL | Status: DC | PRN

## 2011-05-23 MED ORDER — ZOLPIDEM TARTRATE 5 MG PO TABS: 5.0000 mg | ORAL_TABLET | Freq: Every evening | ORAL | Status: DC | PRN

## 2011-05-23 MED ORDER — LACTATED RINGERS IV SOLN
500.0000 mL | Freq: Once | INTRAVENOUS | Status: AC
  Administered 2011-05-23: 1000 mL via INTRAVENOUS

## 2011-05-23 MED ORDER — FENTANYL 2.5 MCG/ML BUPIVACAINE 1/10 % EPIDURAL INFUSION (WH - ANES)
INTRAMUSCULAR | Status: DC | PRN
  Administered 2011-05-23: 14 mL/h via EPIDURAL

## 2011-05-23 MED ORDER — TETANUS-DIPHTH-ACELL PERTUSSIS 5-2.5-18.5 LF-MCG/0.5 IM SUSP
0.5000 mL | Freq: Once | INTRAMUSCULAR | Status: AC
  Administered 2011-05-24: 0.5 mL via INTRAMUSCULAR
  Filled 2011-05-23: qty 0.5

## 2011-05-23 MED ORDER — ONDANSETRON HCL 4 MG PO TABS: 4.0000 mg | ORAL_TABLET | ORAL | Status: DC | PRN

## 2011-05-23 MED ORDER — OXYTOCIN 20 UNITS IN LACTATED RINGERS INFUSION - SIMPLE: 125.0000 mL/h | Freq: Once | INTRAVENOUS | Status: DC

## 2011-05-23 MED ORDER — FLEET ENEMA 7-19 GM/118ML RE ENEM: 1.0000 | ENEMA | RECTAL | Status: DC | PRN

## 2011-05-23 MED ORDER — EPHEDRINE 5 MG/ML INJ
10.0000 mg | INTRAVENOUS | Status: DC | PRN
  Filled 2011-05-23 (×2): qty 4

## 2011-05-23 MED ORDER — IBUPROFEN 600 MG PO TABS
600.0000 mg | ORAL_TABLET | Freq: Four times a day (QID) | ORAL | Status: DC | PRN
  Administered 2011-05-23: 600 mg via ORAL
  Filled 2011-05-23: qty 1

## 2011-05-23 MED ORDER — LIDOCAINE HCL (PF) 1 % IJ SOLN
30.0000 mL | INTRAMUSCULAR | Status: DC | PRN
  Filled 2011-05-23 (×2): qty 30

## 2011-05-23 MED ORDER — PHENYLEPHRINE 40 MCG/ML (10ML) SYRINGE FOR IV PUSH (FOR BLOOD PRESSURE SUPPORT)
80.0000 ug | PREFILLED_SYRINGE | INTRAVENOUS | Status: DC | PRN
  Filled 2011-05-23: qty 5

## 2011-05-23 MED ORDER — LIDOCAINE HCL 1.5 % IJ SOLN
INTRAMUSCULAR | Status: DC | PRN
  Administered 2011-05-23 (×2): 4 mL via EPIDURAL

## 2011-05-23 MED ORDER — CITRIC ACID-SODIUM CITRATE 334-500 MG/5ML PO SOLN: 30.0000 mL | ORAL | Status: DC | PRN

## 2011-05-23 MED ORDER — PHENYLEPHRINE 40 MCG/ML (10ML) SYRINGE FOR IV PUSH (FOR BLOOD PRESSURE SUPPORT)
80.0000 ug | PREFILLED_SYRINGE | INTRAVENOUS | Status: DC | PRN
  Filled 2011-05-23 (×2): qty 5

## 2011-05-23 MED ORDER — MEASLES, MUMPS & RUBELLA VAC ~~LOC~~ INJ
0.5000 mL | INJECTION | Freq: Once | SUBCUTANEOUS | Status: DC
  Filled 2011-05-23: qty 0.5

## 2011-05-23 NOTE — Anesthesia Preprocedure Evaluation (Signed)
Anesthesia Evaluation  Name, MR# and DOB Patient awake  General Assessment Comment  Reviewed: Allergy & Precautions, H&P , Patient's Chart, lab work & pertinent test results  Airway Mallampati: III TM Distance: >3 FB Neck ROM: full    Dental No notable dental hx. (+) Teeth Intact   Pulmonary  clear to auscultation  pulmonary exam normalPulmonary Exam Normal breath sounds clear to auscultation none    Cardiovascular regular Normal    Neuro/Psych Negative Neurological ROS  Negative Psych ROS  GI/Hepatic/Renal negative GI ROS  negative Liver ROS  negative Renal ROS        Endo/Other  Negative Endocrine ROS (+)   Morbid obesity  Abdominal (+) obese,   Musculoskeletal   Hematology negative hematology ROS (+)   Peds  Reproductive/Obstetrics (+) Pregnancy    Anesthesia Other Findings             Anesthesia Physical Anesthesia Plan  ASA: III  Anesthesia Plan: Epidural   Post-op Pain Management:    Induction:   Airway Management Planned:   Additional Equipment:   Intra-op Plan:   Post-operative Plan:   Informed Consent: I have reviewed the patients History and Physical, chart, labs and discussed the procedure including the risks, benefits and alternatives for the proposed anesthesia with the patient or authorized representative who has indicated his/her understanding and acceptance.     Plan Discussed with: Anesthesiologist  Anesthesia Plan Comments:         Anesthesia Quick Evaluation

## 2011-05-23 NOTE — Progress Notes (Signed)
Eustace Pen, CNM at desk. Notified of pt presenting for ROM.  Notified of saturated towel. Will direct admit.

## 2011-05-23 NOTE — Anesthesia Procedure Notes (Signed)
Epidural Patient location during procedure: OB Start time: 05/23/2011 1:45 PM  Staffing Anesthesiologist: Kendall Arnell A. Performed by: anesthesiologist   Preanesthetic Checklist Completed: patient identified, site marked, surgical consent, pre-op evaluation, timeout performed, IV checked, risks and benefits discussed and monitors and equipment checked  Epidural Patient position: sitting Prep: site prepped and draped and DuraPrep Patient monitoring: continuous pulse ox and blood pressure Approach: midline Injection technique: LOR air  Needle:  Needle type: Tuohy  Needle gauge: 17 G Needle length: 9 cm Needle insertion depth: 7 cm Catheter type: closed end flexible Catheter size: 19 Gauge Catheter at skin depth: 12 cm Test dose: negative and 1.5% lidocaine  Assessment Events: blood not aspirated, injection not painful, no injection resistance, negative IV test and no paresthesia  Additional Notes  Patient is more comfortable after epidural dosed. Please see RN's note for documentation of vital signs and FHR which are stable.

## 2011-05-23 NOTE — Progress Notes (Signed)
Addendum to delivery note:  Placenta delivered spontaneously, pitocin infused,  Perineum inspected and intact.

## 2011-05-23 NOTE — H&P (Signed)
[name] [name] is a 25 y.o. white female presenting at 38 weeks per [facility] 06/05/11 for SROM at 0220. Clear fluid.  No VB.  cx 3cm at office last week.  GFM.  Followed by CNM service.  Plans epidural when laboring.  Husband at [facility].  No PIH s/s.   Maternal Medical History:  Reason for admission: Reason for admission: rupture of membranes.  Contractions: Onset was 1-2 hours ago.   Frequency: irregular.   Perceived severity is mild.    Fetal activity: Perceived fetal activity is normal.   Last perceived fetal movement was within the past hour.    Prenatal complications: Polyhydramnios.   1. Late to care 2.  Unknown LMP 3.  PCOS/irreg cycles 4.  Anxiety--Zoloft 50mg  po qd 5.  Obese 6. Polyhydramnios--dx'd 34 wks, resolved by 37 wks 7.  Migraines 8.  Marital issue in pregnancy--counseling/separated part of pregnancy 9.  Chlamydia positive early 2nd trimester 10.  Strong FH diabetes     OB History    Grav Para Term Preterm Abortions TAB SAB Ect Mult Living   2 1 1  0 0 0 0 0 0 1     Past Medical History  Diagnosis Date  . Asthma     childhood, cold induced  . Anxiety   . Borderline diabetes    Past Surgical History  Procedure Date  . No past surgeries    Family History: family history includes Diabetes in her father; Hypertension in her father and mother; Multiple sclerosis in her mother; and Pulmonary embolism in her father. Social History:  reports that she quit smoking about 20 months ago. She does not have any smokeless tobacco history on file. She reports that she does not drink alcohol or use illicit drugs.  Review of Systems  Constitutional: Negative.   Respiratory: Negative.   Cardiovascular: Negative.   Gastrointestinal: Negative.   Genitourinary: Negative.   Skin: Negative.   Neurological: Negative.       Blood pressure 131/71, pulse 95, temperature 98.7 F (37.1 C), temperature source Oral, resp. rate 18, height 5\' 7"  (1.702 m), weight 141.976 kg (313  lb). Maternal Exam:  Uterine Assessment: Contraction strength is mild.  Contraction frequency is regular.  ctxs 2-4; many appear as UI  Abdomen: Patient reports no abdominal tenderness. Estimated fetal weight is 6.5-7.5.   Fetal presentation: vertex  Introitus: Normal vulva. Ferning test: not done.  Nitrazine test: not done. Amniotic fluid character: clear. Copious clear fluid  Pelvis: adequate for delivery.   Cervix: Cervix evaluated by digital exam.     Fetal Exam Fetal Monitor Review: Mode: ultrasound.   Baseline rate: 140.  Variability: moderate (6-25 bpm).   Pattern: no accelerations and variable decelerations.    Fetal State Assessment: Category II - tracings are indeterminate.     Physical Exam  Constitutional: She is oriented to person, place, and time. She appears well-developed and well-nourished. No distress.       anxious  Cardiovascular: Normal rate and regular rhythm.   Respiratory: Effort normal and breath sounds normal.  GI: Soft. Bowel sounds are normal.  Genitourinary:       Cx:  3.5/60/-3  Musculoskeletal: She exhibits no edema.  Neurological: She is alert and oriented to person, place, and time. She has normal reflexes.  Skin: Skin is warm and dry.  Psychiatric: She has a normal mood and affect. Her behavior is normal. Judgment and thought content normal.    Prenatal labs: ABO, Rh:   Antibody:  Rubella:   RPR:    HBsAg:    HIV:    GBS: NEGATIVE (07/05 1640)  Early 1hr gtt=136 3hr-gtt WNL AFP-Nml Assessment/Plan: 1.  IUP 38 wks 2.  Early labor 3.  GBS neg 4.  Mild variables w/ ctxs--intermittent 5.  Anxiety 6.  H/o polyhydramnios late 3rd trimester 7.  Marital issues during pregnancy  1.  Admit to BS with Dr. Estanislado Pandy as attending 2.  Pt stated, " I want this baby out," and when given options of expectant management vs Pitocin, desires to proceed with pitocin in 1 hr if no increase in ctxs. 3.  Epidural prn  4.  Anticipate SVD 5.   Support as needed with PP SW consult 6.  C/w MD prn   Beverly Ferner H 05/23/2011, 4:31 AM

## 2011-05-23 NOTE — Plan of Care (Signed)
Problem: Phase I Progression Outcomes Goal: FHR checked 5 minutes after meds (ROM) Rupture of Membranes Outcome: Not Applicable Date Met:  05/23/11 srom at home; no iv meds given.

## 2011-05-23 NOTE — Progress Notes (Signed)
Pt presents to mau for c/o ROM at 0220 this am.

## 2011-05-23 NOTE — Progress Notes (Signed)
.  Subjective: Feels some cramping, denies need for pain meds   Objective: BP 110/58  Pulse 103  Temp(Src) 98.1 F (36.7 C) (Oral)  Resp 18  Ht 5\' 7"  (1.702 m)  Wt 141.976 kg (313 lb)  BMI 49.02 kg/m2   FHT:  FHR: 130 bpm, variability: moderate,  accelerations:  Present,  decelerations:  Absent UC:   Not tracing with new EFM, toco reapplied SVE:   Dilation:  (not done by cnm.) Effacement (%): 60 Station: -1 Exam by:: h. steelman cnm  Pitocin running   Assessment / Plan: Induction of labor due to PROM,    Fetal Wellbeing:  Category I Pain Control:  Labor support without medications   Coraleigh Sheeran M 05/23/2011, 10:54 AM

## 2011-05-23 NOTE — Progress Notes (Signed)
.  Subjective: Comfortable with epidural  Objective: BP 121/81  Pulse 83  Temp(Src) 99 F (37.2 C) (Axillary)  Resp 20  Ht 5\' 7"  (1.702 m)  Wt 141.976 kg (313 lb)  BMI 49.02 kg/m2  SpO2 94%   FHT:  FHR: 130 bpm, variability: moderate,  accelerations:  Present,  decelerations:  Present occ mild variables UC:   regular, every 2-3 minutes SVE:   Dilation: 5.5 Effacement (%): 60 Station: -1 Exam by:: Kamree Wiens, cnm  IUPC placed with light blood return, flushed easily  Pitocin at 10mu Assessment / Plan: Induction of labor due to PROM,  progressing well on pitocin  Fetal Wellbeing:  Category I Pain Control:  Epidural  continue pitocin for adequate ctx Anticipate svd  Luciano Cinquemani M 05/23/2011, 2:32 PM

## 2011-05-23 NOTE — Progress Notes (Signed)
Pt may go to room 167. 

## 2011-05-24 LAB — CBC
MCHC: 31.4 g/dL (ref 30.0–36.0)
Platelets: 271 10*3/uL (ref 150–400)
RDW: 15.4 % (ref 11.5–15.5)
WBC: 14.4 10*3/uL — ABNORMAL HIGH (ref 4.0–10.5)

## 2011-05-24 NOTE — Progress Notes (Signed)
Post Partum Day 1 Subjective: no complaints; mood stable doing well  Objective: Blood pressure 107/67, pulse 82, temperature 98.4 F (36.9 C), temperature source Oral, resp. rate 20, height 5\' 7"  (1.702 m), weight 141.976 kg (313 lb), SpO2 98.00%, unknown if currently breastfeeding.  Physical Exam:  General: alert Lochia: appropriate Uterine Fundus: firm Incision: intact perineum DVT Evaluation: No evidence of DVT seen on physical exam.   Basename 05/24/11 0530 05/23/11 0415  HGB 10.0* 11.0*  HCT 31.8* 34.7*    Assessment/Plan: Plan for discharge tomorrow, Breastfeeding and Contraception plans tubal.  Patient will sign consent form for tubal at the office, and plan PP tubal.   LOS: 1 day   [name] [name] 05/24/2011, 9:00 AM

## 2011-05-24 NOTE — Progress Notes (Signed)
UR chart review completed.  

## 2011-05-24 NOTE — Progress Notes (Signed)

## 2011-05-24 NOTE — Addendum Note (Signed)
Addendum  created 05/24/11 1220 by Madison Hickman   Modules edited:Charges VN, Notes Section

## 2011-05-24 NOTE — Anesthesia Postprocedure Evaluation (Signed)
  Anesthesia Post-op Note  Patient: [name] [name]  Procedure(s) Performed: * Lumbar Epidural for L&D*  Patient Location: Mother/Baby  Anesthesia Type: Epidural  Level of Consciousness: awake, alert  and oriented  Airway and Oxygen Therapy: Patient Spontanous Breathing  Post-op Pain: none  Post-op Assessment: Post-op Vital signs reviewed, Patient's Cardiovascular Status Stable, Respiratory Function Stable, Patent Airway, No signs of Nausea or vomiting, Adequate PO intake, Pain level controlled, No headache, No backache, No residual numbness and No residual motor weakness  Post-op Vital Signs: Reviewed and stable  Complications: No apparent anesthesia complications

## 2011-05-24 NOTE — Anesthesia Postprocedure Evaluation (Signed)
  Anesthesia Post-op Note  Patient: [name] [name]  Procedure(s) Performed: * No procedures listed *  Patient Location: Mother/Baby  Anesthesia Type: Epidural  Level of Consciousness: awake, alert  and oriented  Airway and Oxygen Therapy: Patient Spontanous Breathing  Post-op Pain: none  Post-op Assessment: Post-op Vital signs reviewed and Patient's Cardiovascular Status Stable  Post-op Vital Signs: Reviewed and stable  Complications: No apparent anesthesia complications

## 2011-05-25 MED ORDER — IBUPROFEN 600 MG PO TABS: 600.0000 mg | ORAL_TABLET | Freq: Four times a day (QID) | ORAL | Status: AC

## 2011-05-25 NOTE — Discharge Summary (Signed)
Obstetric Discharge Summary Reason for Admission: onset of labor Prenatal Procedures: ultrasound Intrapartum Procedures: spontaneous vaginal delivery Postpartum Procedures: none Complications-Operative and Postpartum: none Hemoglobin  Date Value Range Status  05/24/2011 10.0* 12.0-15.0 (g/dL) Final     HCT  Date Value Range Status  05/24/2011 31.8* 36.0-46.0 (%) Final    Discharge Diagnoses: Term Pregnancy-delivered  Discharge Information: Date: 05/25/2011 Activity: per [facility] handout Diet: routine Medications: Ibuprofen Condition: stable Instructions: refer to practice specific booklet Discharge to: home Contraception- plans postpartum tubal-will sign consent at office this week  Follow-up Information    Follow up with [facility]. Make an appointment in 6 weeks. (go by office to sign tubal papers)          Newborn Data: Live born female  Birth Weight: 7 lb 10.2 oz (3464 g) APGAR: 4, 7  Home with mother.  [name] [name] 05/25/2011, 9:42 AM

## 2011-06-29 HISTORY — PX: WISDOM TOOTH EXTRACTION: SHX21

## 2011-07-17 ENCOUNTER — Other Ambulatory Visit: Payer: Self-pay | Admitting: Obstetrics and Gynecology

## 2011-07-25 ENCOUNTER — Other Ambulatory Visit: Payer: Self-pay | Admitting: Obstetrics and Gynecology

## 2011-08-09 ENCOUNTER — Encounter (HOSPITAL_COMMUNITY): Payer: Self-pay

## 2011-08-15 NOTE — Patient Instructions (Addendum)
   Your procedure is scheduled on: Friday, Dec 28th  Enter through the Main Entrance of [facility] at: 9:45am Pick up the phone at the desk and dial [phone] and inform us of your arrival.  Please call this number if you have any problems the morning of surgery: [phone]  Remember: Do not eat food after midnight: Thursday Do not drink clear liquids after: Thursday Take these medicines the morning of surgery with a SIP OF WATER: none  Do not wear jewelry, make-up, or FINGER nail polish Do not wear lotions, powders, perfumes or deodorant. Do not shave 48 hours prior to surgery. Do not bring valuables to the hospital.  Patients discharged on the day of surgery will not be allowed to drive home.   Home with Mother - [name] [name]    Remember to use your hibiclens as instructed.Please shower with 1/2 bottle the evening before your surgery and the other 1/2 bottle the morning of surgery.

## 2011-08-16 ENCOUNTER — Encounter (HOSPITAL_COMMUNITY)
Admission: RE | Admit: 2011-08-16 | Discharge: 2011-08-16 | Disposition: A | Discharge status: Home / Self Care | Payer: Medicaid Other | Source: Ambulatory Visit | Attending: Obstetrics and Gynecology | Admitting: Obstetrics and Gynecology

## 2011-08-16 ENCOUNTER — Encounter (HOSPITAL_COMMUNITY): Payer: Self-pay

## 2011-08-16 HISTORY — DX: Major depressive disorder, single episode, unspecified: F32.9

## 2011-08-16 HISTORY — DX: Depression, unspecified: F32.A

## 2011-08-16 HISTORY — DX: Anemia, unspecified: D64.9

## 2011-08-16 LAB — CBC
MCHC: 31.8 g/dL (ref 30.0–36.0)
RDW: 16 % — ABNORMAL HIGH (ref 11.5–15.5)

## 2011-08-16 LAB — SURGICAL PCR SCREEN
MRSA, PCR: NEGATIVE
Staphylococcus aureus: POSITIVE — AB

## 2011-08-16 NOTE — Pre-Procedure Instructions (Signed)
30 Days papers just received from Dr Robert's office.  Put on chart.

## 2011-08-16 NOTE — Pre-Procedure Instructions (Signed)
Requested 30 Day papers from Dr Robert's office for BTL surgery on 08/25/11.

## 2011-08-16 NOTE — Pre-Procedure Instructions (Signed)
Ok to see patient DOS. 

## 2011-08-21 NOTE — H&P (Signed)
NAME:  [name], [name]                    ACCOUNT NO.:  MEDICAL RECORD NO.:  [id number]  LOCATION:                                 FACILITY:  PHYSICIAN:  Osborn Coho, M.D.   DATE OF BIRTH:  11/18/1985  DATE OF ADMISSION: DATE OF DISCHARGE:                             HISTORY & PHYSICAL   HISTORY OF PRESENT ILLNESS:  [name] [name] is a 25 year old married white female, presenting for permanent sterilization because of her desire to Cease childbearing.  In the past, the patient has used oral contraceptives with limited difficulty, though she does admit that they somewhat worsened her migraines.  The patient has a history of oligomenorrhea and has gone as many as 8 months without her menstrual flow.  When the patient does have her period, however, it may last approximately 7 days requiring her to change a pad every 1-4 hours.  Additionally, she reports cramping, rated as an 8-10/10 on a 10 point pain scale.  However, She chooses no to take anything for those cramps.  She denies any urinary tract symptoms, dyspareunia, changes in her bowel movements, or vaginitis symptoms.  A review was given to the patient of all the methods of contraception, prescription, surgical and over the counter,  However, she desires to proceed with permanent sterilization.  PAST MEDICAL HISTORY:  OB History:  Gravida 2, para 2-0-0-2.  The patient had spontaneous vaginal birth in 2008 and 2012.  GYN History:  Menarche 25 years old.  Last menstrual period July 15, 2011.  She does not use any method of contraception.  She has a history of Chlamydia.  She denies any history of abnormal Pap smears. Her last normal Pap smear was May 2012.  Medical History:  Migraines, anemia, asthma, depression and polycystic ovarian syndrome.  Surgical History:  Negative.  FAMILY HISTORY:  Thyroid disease, hypertension, anemia, multiple sclerosis, migraines, diabetes mellitus, and depression.  HABITS:  The patient  denies any alcohol, tobacco, or illicit drug use.  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  CURRENT MEDICATIONS: 1. Zoloft 100 mg daily. 2. Prenatal vitamins 1 tablet daily.  ALLERGIES:  The patient has no known drug allergies.  She denies any sensitivities to latex, peanuts, shellfish, or soy products.  REVIEW OF SYSTEMS:  The patient wears contact lenses.  She admits to headaches with vision changes only with her migraines.  She denies chest pain, shortness of breath, nausea, vomiting, diarrhea, dysuria, hematuria, urinary frequency or urgency, myalgias arthralgias, skin rashes, or night sweats and except as is mentioned in history of present illness the patient's review of systems is otherwise negative.  PHYSICAL EXAMINATION:  VITAL SIGNS:  Blood pressure 100/70, pulse is 72, temperature 98.0 degrees Fahrenheit  orally, weight 297 pounds, height 5 feet 7 inches tall, body mass index 46.5. NECK:  Supple without masses.  There is no thyromegaly or cervical adenopathy. HEART:  Regular rate and rhythm. LUNGS:  Clear. BACK:  No CVA tenderness. ABDOMEN:  No tenderness, guarding, rebound, or organomegaly. EXTREMITIES:  No clubbing, cyanosis, or edema. PELVIC:  EGBUS is normal.  Vagina is normal.  Cervix is nontender without  lesions.  Uterus appears normal size, shape, and consistency without tenderness, though exam was limited by body habitus.  Adnexa without tenderness or masses.  IMPRESSION:  Desire for permanent sterilization.  DISPOSITION:  A review of the procedure along with the risks associated with surgery was given to the patient to include, but was not limited to, reaction to anesthesia, damage to adjacent organs, excessive bleeding, infection, and failure rate of 1 in 500.  The patient verbalized understanding of these risks and has consented to proceed with permanent sterilization at [facility] on August 25, 2011,  at 11:15 a.m.     Sharilyn Geisinger J. Lowell Guitar, P.A.-C   ______________________________ Osborn Coho, M.D.    EJP/MEDQ  D:  08/18/2011  T:  08/19/2011  Job:  [id number]  No change in H&P - AYR

## 2011-08-25 ENCOUNTER — Ambulatory Visit (HOSPITAL_COMMUNITY)
Admission: RE | Admit: 2011-08-25 | Discharge: 2011-08-25 | Disposition: A | Discharge status: Home / Self Care | Payer: Medicaid Other | Source: Ambulatory Visit | Attending: Obstetrics and Gynecology | Admitting: Obstetrics and Gynecology

## 2011-08-25 ENCOUNTER — Encounter (HOSPITAL_COMMUNITY)
Admission: RE | Disposition: A | Discharge status: Home / Self Care | Payer: Self-pay | Source: Ambulatory Visit | Attending: Obstetrics and Gynecology

## 2011-08-25 ENCOUNTER — Encounter (HOSPITAL_COMMUNITY): Payer: Self-pay | Admitting: Anesthesiology

## 2011-08-25 ENCOUNTER — Encounter (HOSPITAL_COMMUNITY): Payer: Self-pay | Admitting: *Deleted

## 2011-08-25 ENCOUNTER — Ambulatory Visit (HOSPITAL_COMMUNITY): Payer: Medicaid Other | Admitting: Anesthesiology

## 2011-08-25 DIAGNOSIS — Z302 Encounter for sterilization: Secondary | ICD-10-CM | POA: Insufficient documentation

## 2011-08-25 DIAGNOSIS — Z01812 Encounter for preprocedural laboratory examination: Secondary | ICD-10-CM | POA: Insufficient documentation

## 2011-08-25 HISTORY — PX: LAPAROSCOPIC TUBAL LIGATION: SHX1937

## 2011-08-25 LAB — HCG, SERUM, QUALITATIVE: Preg, Serum: NEGATIVE

## 2011-08-25 SURGERY — LIGATION, FALLOPIAN TUBE, LAPAROSCOPIC
Anesthesia: General | Site: Abdomen | Laterality: Bilateral | Wound class: Clean Contaminated

## 2011-08-25 MED ORDER — KETOROLAC TROMETHAMINE 30 MG/ML IJ SOLN: 15.0000 mg | Freq: Once | INTRAMUSCULAR | Status: DC | PRN

## 2011-08-25 MED ORDER — LIDOCAINE HCL (CARDIAC) 20 MG/ML IV SOLN
INTRAVENOUS | Status: DC | PRN
  Administered 2011-08-25: 100 mg via INTRAVENOUS

## 2011-08-25 MED ORDER — ONDANSETRON HCL 4 MG/2ML IJ SOLN
INTRAMUSCULAR | Status: DC | PRN
  Administered 2011-08-25: 4 mg via INTRAVENOUS

## 2011-08-25 MED ORDER — ONDANSETRON HCL 4 MG/2ML IJ SOLN
INTRAMUSCULAR | Status: AC
  Filled 2011-08-25: qty 2

## 2011-08-25 MED ORDER — NEOSTIGMINE METHYLSULFATE 1 MG/ML IJ SOLN
INTRAMUSCULAR | Status: AC
  Filled 2011-08-25: qty 10

## 2011-08-25 MED ORDER — KETOROLAC TROMETHAMINE 30 MG/ML IJ SOLN
INTRAMUSCULAR | Status: AC
  Filled 2011-08-25: qty 1

## 2011-08-25 MED ORDER — PROMETHAZINE HCL 25 MG/ML IJ SOLN: 6.2500 mg | INTRAMUSCULAR | Status: DC | PRN

## 2011-08-25 MED ORDER — ROCURONIUM BROMIDE 100 MG/10ML IV SOLN
INTRAVENOUS | Status: DC | PRN
  Administered 2011-08-25: 30 mg via INTRAVENOUS
  Administered 2011-08-25: 10 mg via INTRAVENOUS

## 2011-08-25 MED ORDER — MIDAZOLAM HCL 5 MG/5ML IJ SOLN
INTRAMUSCULAR | Status: DC | PRN
  Administered 2011-08-25: 2 mg via INTRAVENOUS

## 2011-08-25 MED ORDER — HYDROCODONE-ACETAMINOPHEN 5-500 MG PO TABS: 1.0000 | ORAL_TABLET | Freq: Four times a day (QID) | ORAL | Status: AC | PRN

## 2011-08-25 MED ORDER — GLYCOPYRROLATE 0.2 MG/ML IJ SOLN
INTRAMUSCULAR | Status: AC
  Filled 2011-08-25: qty 2

## 2011-08-25 MED ORDER — DEXAMETHASONE SODIUM PHOSPHATE 10 MG/ML IJ SOLN
INTRAMUSCULAR | Status: AC
  Filled 2011-08-25: qty 1

## 2011-08-25 MED ORDER — FENTANYL CITRATE 0.05 MG/ML IJ SOLN
INTRAMUSCULAR | Status: DC | PRN
  Administered 2011-08-25: 150 ug via INTRAVENOUS
  Administered 2011-08-25: 100 ug via INTRAVENOUS

## 2011-08-25 MED ORDER — ROCURONIUM BROMIDE 50 MG/5ML IV SOLN
INTRAVENOUS | Status: AC
  Filled 2011-08-25: qty 1

## 2011-08-25 MED ORDER — FENTANYL CITRATE 0.05 MG/ML IJ SOLN
25.0000 ug | INTRAMUSCULAR | Status: DC | PRN
  Administered 2011-08-25: 50 ug via INTRAVENOUS

## 2011-08-25 MED ORDER — FENTANYL CITRATE 0.05 MG/ML IJ SOLN
INTRAMUSCULAR | Status: AC
  Filled 2011-08-25: qty 5

## 2011-08-25 MED ORDER — KETOROLAC TROMETHAMINE 30 MG/ML IJ SOLN
INTRAMUSCULAR | Status: DC | PRN
  Administered 2011-08-25: 30 mg via INTRAVENOUS

## 2011-08-25 MED ORDER — LACTATED RINGERS IV SOLN
INTRAVENOUS | Status: DC
  Administered 2011-08-25 (×2): via INTRAVENOUS

## 2011-08-25 MED ORDER — DEXAMETHASONE SODIUM PHOSPHATE 10 MG/ML IJ SOLN
INTRAMUSCULAR | Status: DC | PRN
  Administered 2011-08-25: 10 mg via INTRAVENOUS

## 2011-08-25 MED ORDER — ACETAMINOPHEN 325 MG PO TABS: 325.0000 mg | ORAL_TABLET | ORAL | Status: DC | PRN

## 2011-08-25 MED ORDER — IBUPROFEN 600 MG PO TABS: 600.0000 mg | ORAL_TABLET | Freq: Four times a day (QID) | ORAL | Status: AC | PRN

## 2011-08-25 MED ORDER — PROPOFOL 10 MG/ML IV EMUL
INTRAVENOUS | Status: DC | PRN
  Administered 2011-08-25: 50 mg via INTRAVENOUS
  Administered 2011-08-25: 200 mg via INTRAVENOUS

## 2011-08-25 MED ORDER — FENTANYL CITRATE 0.05 MG/ML IJ SOLN
INTRAMUSCULAR | Status: AC
  Administered 2011-08-25: 50 ug via INTRAVENOUS
  Filled 2011-08-25: qty 2

## 2011-08-25 MED ORDER — PROPOFOL 10 MG/ML IV EMUL
INTRAVENOUS | Status: AC
  Filled 2011-08-25: qty 20

## 2011-08-25 MED ORDER — BUPIVACAINE HCL (PF) 0.25 % IJ SOLN
INTRAMUSCULAR | Status: DC | PRN
  Administered 2011-08-25: 10 mL

## 2011-08-25 MED ORDER — NEOSTIGMINE METHYLSULFATE 1 MG/ML IJ SOLN
INTRAMUSCULAR | Status: DC | PRN
  Administered 2011-08-25: 2 mg via INTRAVENOUS

## 2011-08-25 MED ORDER — LIDOCAINE HCL (CARDIAC) 20 MG/ML IV SOLN
INTRAVENOUS | Status: AC
  Filled 2011-08-25: qty 5

## 2011-08-25 MED ORDER — MIDAZOLAM HCL 2 MG/2ML IJ SOLN
INTRAMUSCULAR | Status: AC
  Filled 2011-08-25: qty 2

## 2011-08-25 MED ORDER — GLYCOPYRROLATE 0.2 MG/ML IJ SOLN
INTRAMUSCULAR | Status: DC | PRN
  Administered 2011-08-25: .2 mg via INTRAVENOUS
  Administered 2011-08-25: .4 mg via INTRAVENOUS

## 2011-08-25 SURGICAL SUPPLY — 19 items
CATH ROBINSON RED A/P 16FR (CATHETERS) ×2 IMPLANT
CHLORAPREP W/TINT 26ML (MISCELLANEOUS) ×2 IMPLANT
CLOTH BEACON ORANGE TIMEOUT ST (SAFETY) ×2 IMPLANT
DRSG COVADERM 4X6 (GAUZE/BANDAGES/DRESSINGS) ×2 IMPLANT
GLOVE BIO SURGEON STRL SZ7.5 (GLOVE) ×4 IMPLANT
GLOVE BIOGEL PI IND STRL 7.5 (GLOVE) ×1 IMPLANT
GLOVE BIOGEL PI INDICATOR 7.5 (GLOVE) ×1
GOWN PREVENTION PLUS LG XLONG (DISPOSABLE) ×2 IMPLANT
NEEDLE HYPO 25X1 1.5 SAFETY (NEEDLE) ×2 IMPLANT
NEEDLE INSUFFLATION 14GA 120MM (NEEDLE) ×2 IMPLANT
NEEDLE INSUFFLATION 14GA 150MM (NEEDLE) ×2 IMPLANT
PACK LAPAROSCOPY BASIN (CUSTOM PROCEDURE TRAY) ×2 IMPLANT
SUT MON AB 4-0 PS1 27 (SUTURE) ×2 IMPLANT
SUT VICRYL 0 UR6 27IN ABS (SUTURE) ×2 IMPLANT
TOWEL OR 17X24 6PK STRL BLUE (TOWEL DISPOSABLE) ×4 IMPLANT
TROCAR 12M 150ML BLUNT (TROCAR) ×2 IMPLANT
TROCAR Z-THREAD FIOS 11X100 BL (TROCAR) ×2 IMPLANT
WARMER LAPAROSCOPE (MISCELLANEOUS) ×2 IMPLANT
WATER STERILE IRR 1000ML POUR (IV SOLUTION) ×2 IMPLANT

## 2011-08-25 NOTE — Transfer of Care (Signed)
Immediate Anesthesia Transfer of Care Note  Patient: [name] [name]  Procedure(s) Performed:  LAPAROSCOPIC TUBAL LIGATION  Patient Location: PACU  Anesthesia Type: General  Level of Consciousness: sedated  Airway & Oxygen Therapy: Patient Spontanous Breathing and Patient connected to nasal cannula oxygen  Post-op Assessment: Report given to PACU RN and Post -op Vital signs reviewed and stable  Post vital signs: stable  Complications: No apparent anesthesia complications

## 2011-08-25 NOTE — Anesthesia Postprocedure Evaluation (Signed)
Anesthesia Post Note  Patient: [name] [name]  Procedure(s) Performed:  LAPAROSCOPIC TUBAL LIGATION  Anesthesia type: GA  Patient location: PACU  Post pain: Pain level controlled  Post assessment: Post-op Vital signs reviewed  Last Vitals:  Filed Vitals:   08/25/11 1300  BP: 103/62  Pulse: 51  Temp:   Resp: 16    Post vital signs: Reviewed  Level of consciousness: sedated  Complications: No apparent anesthesia complications

## 2011-08-25 NOTE — Anesthesia Preprocedure Evaluation (Signed)
Anesthesia Evaluation  Patient identified by MRN, date of birth, ID band Patient awake    Reviewed: Allergy & Precautions, H&P , Patient's Chart, lab work & pertinent test results, reviewed documented beta blocker date and time   History of Anesthesia Complications Negative for: history of anesthetic complications  Airway Mallampati: III TM Distance: >3 FB Neck ROM: full    Dental No notable dental hx.    Pulmonary neg pulmonary ROS, asthma ,  clear to auscultation  Pulmonary exam normal       Cardiovascular Exercise Tolerance: Good neg cardio ROS regular Normal    Neuro/Psych  Headaches, Negative Neurological ROS  Negative Psych ROS   GI/Hepatic negative GI ROS, Neg liver ROS,   Endo/Other  Negative Endocrine ROSDiabetes mellitus-Morbid obesity  Renal/GU negative Renal ROS     Musculoskeletal   Abdominal   Peds  Hematology negative hematology ROS (+)   Anesthesia Other Findings   Reproductive/Obstetrics negative OB ROS                           Anesthesia Physical Anesthesia Plan  ASA: III  Anesthesia Plan: General ETT   Post-op Pain Management:    Induction:   Airway Management Planned:   Additional Equipment:   Intra-op Plan:   Post-operative Plan:   Informed Consent: I have reviewed the patients History and Physical, chart, labs and discussed the procedure including the risks, benefits and alternatives for the proposed anesthesia with the patient or authorized representative who has indicated his/her understanding and acceptance.   Dental Advisory Given  Plan Discussed with: CRNA, Surgeon and Anesthesiologist  Anesthesia Plan Comments:         Anesthesia Quick Evaluation

## 2011-08-25 NOTE — Op Note (Signed)
Preop Diagnosis: Desires Permanent Sterilization   Postop Diagnosis: Desires Permanent Sterilization   Procedure: LAPAROSCOPIC TUBAL LIGATION   Anesthesia: General   Anesthesiologist: Velna Hatchet, MD   Attending: Purcell Nails, MD   Assistant: N/a  Findings: Normal appearing ovaries and fallopian tubes  Pathology: N/a  Fluids:   UOP: QS via straight cath prior to procedure  EBL: Minimal  Complications: None  Procedure: The patient was taken to the operating room after the risks, benefits, alternatives, complications, treatment options, and expected outcomes were discussed with the patient. The patient verbalized understanding, the patient concurred with the proposed plan and consent signed and witnessed. The patient was taken to the Operating Room, identified as [name] [name] and the procedure verified as laparoscopic bilateral tubal fulguration. A Time Out was held and the above information confirmed.  The patient was placed under general anesthesia per anesthesia staff, the patient was placed in modified dorsal lithotomy position and was prepped, draped, and catheterized in the normal, sterile fashion.  The cervix was visualized and an intrauterine manipulator was placed. A  10 mm umbilical incision was then performed. Veress needle was passed and pneumoperitoneum was established. A 10 mm trocar was advanced into the intraabdominal cavity, the operative laparoscope was introduced and findings as noted above.  The right fallopian tube was carried out to its fimbriated end and in the isthmic portion cauterized for at least 3 consecutive burns.  The same was done on the contralateral side.    Following the procedure the umbilical sheath was removed after intra-abdominal carbon dioxide was expressed. The fascia was repaired with 0 vicryl via a figure of eight stitch and the incision was closed with 3-0 vicryl via subcuticular sutures. The intrauterine manipulator was  then removed.  Instrument, sponge, and needle counts were correct.  The patient tolerated the procedure well and was returned to the recovery room in good condition.

## 2011-08-28 ENCOUNTER — Encounter (HOSPITAL_COMMUNITY): Payer: Self-pay | Admitting: Obstetrics and Gynecology

## 2011-10-25 ENCOUNTER — Emergency Department (HOSPITAL_COMMUNITY)
Admission: EM | Admit: 2011-10-25 | Discharge: 2011-10-25 | Disposition: A | Discharge status: Home / Self Care | Payer: Medicaid Other | Source: Home / Self Care

## 2011-10-25 ENCOUNTER — Encounter (HOSPITAL_COMMUNITY): Payer: Self-pay | Admitting: Cardiology

## 2011-10-25 DIAGNOSIS — L0291 Cutaneous abscess, unspecified: Secondary | ICD-10-CM

## 2011-10-25 MED ORDER — TRAMADOL HCL 50 MG PO TABS: 50.0000 mg | ORAL_TABLET | Freq: Four times a day (QID) | ORAL | Status: AC | PRN

## 2011-10-25 NOTE — Discharge Instructions (Signed)

## 2011-10-25 NOTE — ED Notes (Signed)
Pt started with raised area to tailbone area that started a week ago. Denies fever but has felt warm.

## 2011-10-31 NOTE — ED Provider Notes (Signed)
[name] [name] is a 26 y.o. female who presents to Urgent Care today for pilonidal abscess.  Present x 1 week, increasing in pain when she sits.  Has tried warm compresses for relief.  No spontaneous drainage.  No fevers or chills.  Has not had anything like this before.     PMH reviewed.  ROS as above otherwise neg Medications reviewed. No current facility-administered medications for this encounter.   Current Outpatient Prescriptions  Medication Sig Dispense Refill  . HYDROcodone-acetaminophen (VICODIN) 5-500 MG per tablet Take 1 tablet by mouth every 6 (six) hours as needed.      . sertraline (ZOLOFT) 100 MG tablet Take 100 mg by mouth at bedtime.        . Prenatal Vit-Fe Fumarate-FA (MULTIVITAMIN-PRENATAL) 27-0.8 MG TABS Take 1 tablet by mouth daily.        . traMADol (ULTRAM) 50 MG tablet Take 1 tablet (50 mg total) by mouth every 6 (six) hours as needed for pain.  30 tablet  0    Exam:  BP 112/72  Pulse 110  Temp(Src) 99.7 F (37.6 C) (Oral)  Resp 18  SpO2 100%  LMP 09/25/2011  Breastfeeding? No Gen: Well NAD HEENT: EOMI,  MMM Lungs: CTABL Nl WOB Heart: RRR no MRG Abd: NABS, NT, ND Skin:  1.5 cm fluctant area inferior and slightly to right of coccyx.  Surrounding erythema noted  Informed consent given and signed copy in chart.  Nurse present for assistance and as chaperone throughout entire procedure. Appropraite time out taken. Area prepped and draped in usual sterile fashion. Local anasthesia with 2% lidocaine with epi (8 cc). Small 1 cm incision made horizontally across center of mass.  Easily expressed purulent material.  Hemostats used to break up loculations. Wound packed with Iodoform, patient given instructions to return in 3 days for wound check and to have packing removed.  Area was cleaned and topical abx ointment applied. Bandage applied.  No complications. EBL < 5 cc.  Assessment and Plan: 1.  Abscess:  Pilonidal, drained in Urgent Care as above without  complication.  FU as above.

## 2011-11-03 NOTE — ED Provider Notes (Signed)
Medical screening examination/treatment/procedure(s) were performed by resident physician or non-physician practitioner and as supervising physician I was immediately available for consultation/collaboration.   Kymberlie Brazeau DOUGLAS MD.    Jeni Duling Douglas Devinne Epstein, MD 11/03/11 0824 

## 2013-12-03 ENCOUNTER — Emergency Department (HOSPITAL_COMMUNITY)
Admission: EM | Admit: 2013-12-03 | Discharge: 2013-12-03 | Disposition: A | Discharge status: Home / Self Care | Payer: Medicaid Other | Attending: Emergency Medicine | Admitting: Emergency Medicine

## 2013-12-03 DIAGNOSIS — Z862 Personal history of diseases of the blood and blood-forming organs and certain disorders involving the immune mechanism: Secondary | ICD-10-CM | POA: Insufficient documentation

## 2013-12-03 DIAGNOSIS — N23 Unspecified renal colic: Secondary | ICD-10-CM | POA: Insufficient documentation

## 2013-12-03 DIAGNOSIS — Z8659 Personal history of other mental and behavioral disorders: Secondary | ICD-10-CM | POA: Insufficient documentation

## 2013-12-03 DIAGNOSIS — Z8619 Personal history of other infectious and parasitic diseases: Secondary | ICD-10-CM | POA: Insufficient documentation

## 2013-12-03 DIAGNOSIS — Z87891 Personal history of nicotine dependence: Secondary | ICD-10-CM | POA: Insufficient documentation

## 2013-12-03 DIAGNOSIS — Z87442 Personal history of urinary calculi: Secondary | ICD-10-CM | POA: Insufficient documentation

## 2013-12-03 DIAGNOSIS — Z3202 Encounter for pregnancy test, result negative: Secondary | ICD-10-CM | POA: Insufficient documentation

## 2013-12-03 DIAGNOSIS — Z8639 Personal history of other endocrine, nutritional and metabolic disease: Secondary | ICD-10-CM | POA: Insufficient documentation

## 2013-12-03 DIAGNOSIS — J45909 Unspecified asthma, uncomplicated: Secondary | ICD-10-CM | POA: Insufficient documentation

## 2013-12-03 LAB — CBC WITH DIFFERENTIAL/PLATELET
BASOS ABS: 0 10*3/uL (ref 0.0–0.1)
BASOS PCT: 0 % (ref 0–1)
EOS ABS: 0 10*3/uL (ref 0.0–0.7)
Eosinophils Relative: 0 % (ref 0–5)
HCT: 36.7 % (ref 36.0–46.0)
Hemoglobin: 12.1 g/dL (ref 12.0–15.0)
Lymphocytes Relative: 27 % (ref 12–46)
Lymphs Abs: 2.7 10*3/uL (ref 0.7–4.0)
MCH: 25.9 pg — AB (ref 26.0–34.0)
MCHC: 33 g/dL (ref 30.0–36.0)
MCV: 78.4 fL (ref 78.0–100.0)
Monocytes Absolute: 0.8 10*3/uL (ref 0.1–1.0)
Monocytes Relative: 8 % (ref 3–12)
NEUTROS ABS: 6.6 10*3/uL (ref 1.7–7.7)
NEUTROS PCT: 65 % (ref 43–77)
Platelets: 279 10*3/uL (ref 150–400)
RBC: 4.68 MIL/uL (ref 3.87–5.11)
RDW: 14.9 % (ref 11.5–15.5)
WBC: 10 10*3/uL (ref 4.0–10.5)

## 2013-12-03 LAB — BASIC METABOLIC PANEL
BUN: 13 mg/dL (ref 6–23)
CHLORIDE: 103 meq/L (ref 96–112)
CO2: 23 mEq/L (ref 19–32)
CREATININE: 0.68 mg/dL (ref 0.50–1.10)
Calcium: 9.3 mg/dL (ref 8.4–10.5)
Glucose, Bld: 152 mg/dL — ABNORMAL HIGH (ref 70–99)
POTASSIUM: 4.5 meq/L (ref 3.7–5.3)
Sodium: 139 mEq/L (ref 137–147)

## 2013-12-03 LAB — URINALYSIS, ROUTINE W REFLEX MICROSCOPIC
Bilirubin Urine: NEGATIVE
Glucose, UA: NEGATIVE mg/dL
KETONES UR: NEGATIVE mg/dL
NITRITE: NEGATIVE
PROTEIN: NEGATIVE mg/dL
Specific Gravity, Urine: 1.019 (ref 1.005–1.030)
UROBILINOGEN UA: 0.2 mg/dL (ref 0.0–1.0)
pH: 5.5 (ref 5.0–8.0)

## 2013-12-03 LAB — URINE MICROSCOPIC-ADD ON

## 2013-12-03 LAB — POC URINE PREG, ED: PREG TEST UR: NEGATIVE

## 2013-12-03 NOTE — ED Notes (Signed)
See downtime charting for triage.  

## 2013-12-03 NOTE — ED Notes (Signed)
Pt given urine strainer

## 2013-12-03 NOTE — ED Notes (Signed)
Dr. Manly at the bedside.  

## 2013-12-08 NOTE — ED Provider Notes (Signed)
CSN: [id number]     Arrival date & time 12/03/13  0248 History   First MD Initiated Contact with Patient 12/03/13 0503     Chief Complaint  Patient presents with  . Flank Pain     (Consider location/radiation/quality/duration/timing/severity/associated sxs/prior Treatment) HPI  28 yo female presents with complaints of transient left sided flank pain. She says that she believes she has passed a kidney stone in the past and says that pain feels similar. Pain is aching, nonradiating. No fever. No dysuria. No N/V/D. Nothing makes pain worse or better. Pain comes in waves. No vaginal discharge.   Past Medical History  Diagnosis Date  . Anxiety   . Borderline diabetes   . Chlamydia   . HPV (human papilloma virus) infection   . Depression   . Asthma     childhood, cold induced, no prob as adult, no inhaler  . Anemia     hx with pregnancy  . Headache     otc med prn   Past Surgical History  Procedure Laterality Date  . No past surgeries    . Wisdom tooth extraction  06/2011  . Svd      x 2 - 2008, 2012  . Laparoscopic tubal ligation  08/25/2011    Procedure: LAPAROSCOPIC TUBAL LIGATION;  Surgeon: Delice Lesch, MD;  Location: [facility];  Service: Gynecology;  Laterality: Bilateral;   Family History  Problem Relation Age of Onset  . Multiple sclerosis Mother   . Hypertension Mother   . Pulmonary embolism Father   . Hypertension Father   . Diabetes Father    History  Substance Use Topics  . Smoking status: Former Smoker -- 0.25 packs/day for 2 years    Types: Cigarettes    Quit date: 08/28/2009  . Smokeless tobacco: Never Used  . Alcohol Use: No   OB History   Grav Para Term Preterm Abortions TAB SAB Ect Mult Living   '2 2 2 ' 0 0 0 0 0 0 2     Review of Systems Ten point review of symptoms performed and is negative with the exception of symptoms noted above.     Allergies  Review of patient's allergies indicates no known allergies.  Home Medications   Current  Outpatient Rx  Name  Route  Sig  Dispense  Refill  . ibuprofen (ADVIL,MOTRIN) 200 MG tablet   Oral   Take 200-400 mg by mouth every 6 (six) hours as needed for headache or moderate pain.          BP 120/60  Pulse 56  Temp(Src) 98 F (36.7 C) (Oral)  Resp 18  SpO2 96% Physical Exam Gen: well developed and well nourished appearing Head: NCAT Eyes: PERL, EOMI Nose: no epistaixis or rhinorrhea Mouth/throat: mucosa is moist and pink Neck: supple, no stridor Lungs: CTA B, no wheezing, rhonchi or rales CV: regular rate and rythm, good distal pulses.  Abd: soft, notender, nondistended Back: no ttp, no cva ttp Skin: warm and dry Ext: no edema, normal to inspection Neuro: CN ii-xii grossly intact, no focal deficits Psyche; normal affect,  calm and cooperative.  ED Course  Procedures (including critical care time) Labs Review Recent Results (from the past 2160 hour(s))  URINALYSIS, ROUTINE W REFLEX MICROSCOPIC     Status: Abnormal   Collection Time    12/03/13  4:27 AM      Result Value Ref Range   Color, Urine YELLOW  YELLOW   APPearance CLOUDY (*) CLEAR  Specific Gravity, Urine 1.019  1.005 - 1.030   pH 5.5  5.0 - 8.0   Glucose, UA NEGATIVE  NEGATIVE mg/dL   Hgb urine dipstick MODERATE (*) NEGATIVE   Bilirubin Urine NEGATIVE  NEGATIVE   Ketones, ur NEGATIVE  NEGATIVE mg/dL   Protein, ur NEGATIVE  NEGATIVE mg/dL   Urobilinogen, UA 0.2  0.0 - 1.0 mg/dL   Nitrite NEGATIVE  NEGATIVE   Leukocytes, UA MODERATE (*) NEGATIVE  URINE MICROSCOPIC-ADD ON     Status: None   Collection Time    12/03/13  4:27 AM      Result Value Ref Range   Squamous Epithelial / LPF RARE  RARE   WBC, UA 3-6  <3 WBC/hpf   RBC / HPF 3-6  <3 RBC/hpf   Bacteria, UA RARE  RARE  POC URINE PREG, ED     Status: None   Collection Time    12/03/13  4:30 AM      Result Value Ref Range   Preg Test, Ur NEGATIVE  NEGATIVE   Comment:            THE SENSITIVITY OF THIS     METHODOLOGY IS >24 mIU/mL  CBC  WITH DIFFERENTIAL     Status: Abnormal   Collection Time    12/03/13  4:55 AM      Result Value Ref Range   WBC 10.0  4.0 - 10.5 K/uL   RBC 4.68  3.87 - 5.11 MIL/uL   Hemoglobin 12.1  12.0 - 15.0 g/dL   HCT 36.7  36.0 - 46.0 %   MCV 78.4  78.0 - 100.0 fL   MCH 25.9 (*) 26.0 - 34.0 pg   MCHC 33.0  30.0 - 36.0 g/dL   RDW 14.9  11.5 - 15.5 %   Platelets 279  150 - 400 K/uL   Neutrophils Relative % 65  43 - 77 %   Neutro Abs 6.6  1.7 - 7.7 K/uL   Lymphocytes Relative 27  12 - 46 %   Lymphs Abs 2.7  0.7 - 4.0 K/uL   Monocytes Relative 8  3 - 12 %   Monocytes Absolute 0.8  0.1 - 1.0 K/uL   Eosinophils Relative 0  0 - 5 %   Eosinophils Absolute 0.0  0.0 - 0.7 K/uL   Basophils Relative 0  0 - 1 %   Basophils Absolute 0.0  0.0 - 0.1 K/uL  BASIC METABOLIC PANEL     Status: Abnormal   Collection Time    12/03/13  4:55 AM      Result Value Ref Range   Sodium 139  137 - 147 mEq/L   Potassium 4.5  3.7 - 5.3 mEq/L   Chloride 103  96 - 112 mEq/L   CO2 23  19 - 32 mEq/L   Glucose, Bld 152 (*) 70 - 99 mg/dL   BUN 13  6 - 23 mg/dL   Creatinine, Ser 0.68  0.50 - 1.10 mg/dL   Calcium 9.3  8.4 - 10.5 mg/dL   GFR calc non Af Amer >90  >90 mL/min   GFR calc Af Amer >90  >90 mL/min   Comment: (NOTE)     The eGFR has been calculated using the CKD EPI equation.     This calculation has not been validated in all clinical situations.     eGFR's persistently <90 mL/min signify possible Chronic Kidney     Disease.    MDM   Final  diagnoses:  Renal colic on left side        Elyn Peers, MD 12/08/13 [phone]

## 2014-06-29 ENCOUNTER — Encounter (HOSPITAL_COMMUNITY): Payer: Self-pay | Admitting: Cardiology

## 2015-10-16 ENCOUNTER — Emergency Department (HOSPITAL_COMMUNITY)
Admission: EM | Admit: 2015-10-16 | Discharge: 2015-10-16 | Disposition: A | Discharge status: Home / Self Care | Payer: Self-pay | Source: Home / Self Care | Attending: Emergency Medicine | Admitting: Emergency Medicine

## 2015-10-16 ENCOUNTER — Encounter (HOSPITAL_COMMUNITY): Payer: Self-pay | Admitting: Emergency Medicine

## 2015-10-16 DIAGNOSIS — J069 Acute upper respiratory infection, unspecified: Secondary | ICD-10-CM

## 2015-10-16 MED ORDER — CETIRIZINE HCL 10 MG PO TABS: 10.0000 mg | ORAL_TABLET | Freq: Every day | ORAL | Status: DC

## 2015-10-16 MED ORDER — FLUTICASONE PROPIONATE 50 MCG/ACT NA SUSP: 2.0000 | Freq: Every day | NASAL | Status: DC

## 2015-10-16 MED ORDER — AZITHROMYCIN 250 MG PO TABS: ORAL_TABLET | ORAL | Status: DC

## 2015-10-16 NOTE — ED Provider Notes (Signed)
CSN: [id number]     Arrival date & time 10/16/15  1727 History   First MD Initiated Contact with Patient 10/16/15 1841     Chief Complaint  Patient presents with  . Sore Throat  . Facial Pain   (Consider location/radiation/quality/duration/timing/severity/associated sxs/prior Treatment) HPI  She is a 30 year old woman here for evaluation of sore throat and nasal congestion. Her symptoms started 2 days ago. She reports nasal congestion, rhinorrhea, postnasal drainage, sore throat, and cough. She's also had some nausea, but no vomiting. She reports intermittent bilateral ear pain. No fevers or chills. 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  Past Medical History  Diagnosis Date  . Anxiety   . Borderline diabetes   . Chlamydia   . HPV (human papilloma virus) infection   . Depression   . Asthma     childhood, cold induced, no prob as adult, no inhaler  . Anemia     hx with pregnancy  . Headache(784.0)     otc med prn   Past Surgical History  Procedure Laterality Date  . No past surgeries    . Wisdom tooth extraction  06/2011  . Svd      x 2 - 2008, 2012  . Laparoscopic tubal ligation  08/25/2011    Procedure: LAPAROSCOPIC TUBAL LIGATION;  Surgeon: Purcell Nails, MD;  Location: [facility];  Service: Gynecology;  Laterality: Bilateral;   Family History  Problem Relation Age of Onset  . Multiple sclerosis Mother   . Hypertension Mother   . Pulmonary embolism Father   . Hypertension Father   . Diabetes Father    Social History  Substance Use Topics  . Smoking status: Former Smoker -- 0.25 packs/day for 2 years    Types: Cigarettes    Quit date: 08/28/2009  . Smokeless tobacco: Never Used  . Alcohol Use: No   OB History    Gravida Para Term Preterm AB TAB SAB Ectopic Multiple Living   0 0 0 0 0 0 2     Review of Systems As in history of present illness Allergies  Review of patient's allergies indicates no known allergies.  Home Medications    Prior to Admission medications   Medication Sig Start Date End Date Taking? Authorizing Provider  azithromycin (ZITHROMAX Z-PAK) 250 MG tablet Take 2 pills today, then 1 pill daily until gone. 10/16/15   Charm Rings, MD  cetirizine (ZYRTEC) 10 MG tablet Take 1 tablet (10 mg total) by mouth daily. 10/16/15   Charm Rings, MD  fluticasone (FLONASE) 50 MCG/ACT nasal spray Place 2 sprays into both nostrils daily. 10/16/15   Charm Rings, MD  ibuprofen (ADVIL,MOTRIN) 200 MG tablet Take 200-400 mg by mouth every 6 (six) hours as needed for headache or moderate pain.    Historical Provider, MD   Meds Ordered and Administered this Visit  Medications - No data to display  BP 128/57 mmHg  Pulse 66  Temp(Src) 98.6 F (37 C) (Oral)  SpO2 99%  LMP 09/19/2015 (Exact Date) No data found.   Physical Exam  Constitutional: She is oriented to person, place, and time. She appears well-developed and well-nourished. No distress.  HENT:  Mouth/Throat: No oropharyngeal exudate.  Streaky erythema in the oropharynx. There is nasal discharge present. Nasal mucosa is erythematous. Left TM is slightly retracted. Right TM is normal.  Neck: Neck supple.  Cardiovascular: Normal rate, regular rhythm and normal heart sounds.  No murmur heard. Pulmonary/Chest: Effort normal and breath sounds normal. No respiratory distress. She has no wheezes. She has no rales.  Lymphadenopathy:    She has no cervical adenopathy.  Neurological: She is alert and oriented to person, place, and time.    ED Course  Procedures (including critical care time)  Labs Review Labs Reviewed - No data to display  Imaging Review No results found.    MDM   1. URI, acute    Symptomatic treatment with Flonase and cetirizine. If not improving by Tuesday, she will take azithromycin. Follow-up as needed.    Charm Rings, MD 10/16/15 1901

## 2015-10-16 NOTE — ED Notes (Addendum)
The patient presented to the [facility] with a complaint of a sore throat and sinus pressure x 2 days. The patient denied any fever. The patient also stated that she was having left sided otalgia.

## 2015-10-16 NOTE — Discharge Instructions (Signed)
You have a nasty cold virus. Use Flonase daily. Takes cetirizine daily. Make sure you are drinking plenty of fluids. Fill the prescription for azithromycin if you are not getting better by Tuesday. Follow-up as needed.

## 2017-07-27 ENCOUNTER — Other Ambulatory Visit: Payer: Self-pay

## 2017-07-27 ENCOUNTER — Encounter (HOSPITAL_COMMUNITY): Payer: Self-pay | Admitting: *Deleted

## 2017-07-27 ENCOUNTER — Emergency Department (HOSPITAL_COMMUNITY)
Admission: EM | Admit: 2017-07-27 | Discharge: 2017-07-27 | Disposition: A | Discharge status: Home / Self Care | Payer: Self-pay | Attending: Emergency Medicine | Admitting: Emergency Medicine

## 2017-07-27 DIAGNOSIS — L723 Sebaceous cyst: Secondary | ICD-10-CM | POA: Insufficient documentation

## 2017-07-27 DIAGNOSIS — Z79899 Other long term (current) drug therapy: Secondary | ICD-10-CM | POA: Insufficient documentation

## 2017-07-27 DIAGNOSIS — L089 Local infection of the skin and subcutaneous tissue, unspecified: Secondary | ICD-10-CM | POA: Insufficient documentation

## 2017-07-27 DIAGNOSIS — J45909 Unspecified asthma, uncomplicated: Secondary | ICD-10-CM | POA: Insufficient documentation

## 2017-07-27 DIAGNOSIS — Z87891 Personal history of nicotine dependence: Secondary | ICD-10-CM | POA: Insufficient documentation

## 2017-07-27 MED ORDER — IBUPROFEN 600 MG PO TABS: 600.0000 mg | ORAL_TABLET | Freq: Four times a day (QID) | ORAL | 0 refills | Status: DC | PRN

## 2017-07-27 MED ORDER — LIDOCAINE-EPINEPHRINE (PF) 2 %-1:200000 IJ SOLN
10.0000 mL | Freq: Once | INTRAMUSCULAR | Status: AC
  Administered 2017-07-27: 10 mL
  Filled 2017-07-27: qty 20

## 2017-07-27 MED ORDER — DOXYCYCLINE HYCLATE 100 MG PO CAPS: 100.0000 mg | ORAL_CAPSULE | Freq: Two times a day (BID) | ORAL | 0 refills | Status: AC

## 2017-07-27 NOTE — Discharge Instructions (Signed)
Please see the information and instructions below regarding your visit.  Your diagnoses today include:  1. Infected sebaceous cyst     Abscesses form when an infection in your skin starts to collect bacteria and white blood cells, walling it off from the rest of your body to protect you from a bigger infection. Risk factors for this type of infection include:  ?Break in the skin ?Diabetes ?Swollen areas  Sometimes the infection starts to spread to surrounding tissue, causing redness and swelling. We call this cellulitis.   Tests performed today include: See side panel of your discharge paperwork for testing performed today. Vital signs are listed at the bottom of these instructions.   Ultrasound  Medications prescribed:    Take any prescribed medications only as prescribed, and any over the counter medications only as directed on the packaging.  1. Antibiotic. Doxycycline. Please take all of your antibiotics until finished.   You may develop abdominal discomfort or nausea from the antibiotic. If this occurs, you may take it with food. Some patients also get diarrhea with antibiotics. You may help offset this with probiotics which you can buy or get in yogurt. Do not eat or take the probiotics until 2 hours after your antibiotic. Some women develop vaginal yeast infections after antibiotics. If you develop unusual vaginal discharge after being on this medication, please see your primary care provider.   Some people develop allergies to antibiotics. Symptoms of antibiotic allergy can be mild and include a flat rash and itching. They can also be more serious and include:  ?Hives - Hives are raised, red patches of skin that are usually very itchy.  ?Lip or tongue swelling  ?Trouble swallowing or breathing  ?Blistering of the skin or mouth.  If you have any of these serious symptoms, please seek emergency medical care immediately.  2.  Please alternate ibuprofen and Tylenol for pain.   You may take 600 mg of ibuprofen every 6 hours as needed for pain and 650 mg of Tylenol every 6 hours as needed for pain.  Please do not exceed 4 g of Tylenol in 1 day.  Home care instructions:  Please follow any educational materials contained in this packet.   Some things that may promote healing of your wound and infection include:  Keep the infected area clean and dry. You can take a shower or bath, but be sure to pat the area dry with a towel afterward. Do not put any antibiotic ointments or creams on the area. Reapply a dry gauze dressing any time the bandage has become soaked with drainage, or after cleansing the wound.  Apply warm compresses to the wound 3-4 times daily to encourage drainage.   Return instructions:  Please return to the Emergency Department if you experience worsening symptoms. You should return for reevaluation of your infection if you notice spreading redness, increased swelling, an abscess develops, or you develop signs and symptoms of a systemic illness such as fever and chills.  Please return if you have any other emergent concerns.  Additional Information:   Your vital signs today were: BP 127/74 (BP Location: Right Arm)    Pulse 79    Temp 97.6 F (36.4 C) (Oral)    Resp 12    Ht 5\' 7"  (1.702 m)    Wt (!) 138.3 kg (305 lb)    LMP 06/26/2017    SpO2 98%    BMI 47.77 kg/m  If your blood pressure (BP) was elevated on multiple  readings during this visit above 130 for the top number or above 80 for the bottom number, please have this repeated by your primary care provider within one month. --------------  Thank you for allowing us to participate in your care today.

## 2017-07-27 NOTE — ED Triage Notes (Signed)
Swollen area beneath her lt ear lt neck for 2 weeks  It is increasing in size and she has occassional pain from that area  lmp last month

## 2017-07-27 NOTE — ED Notes (Signed)
ED Provider at bedside. 

## 2017-07-28 NOTE — ED Provider Notes (Signed)
[facility]   CSN: [id number] Arrival date & time: 07/27/17  1613     History   Chief Complaint Chief Complaint  Patient presents with  . Mass    HPI Harold Hedgeicole D Haze is a 31 y.o. female.  HPI   Patient is a 31 year old female with a history of anxiety, depression, and headache presenting for a "lump" in the left posterior neck.  Patient reports that she has had a small lump there for approximately 1 year without pain or problem.  Over the past 2-3 days, patient has noted erythema, swelling, and pain of this lump.  It is increased most acutely in the past 24 hours.  Previously, patient has attempted lancing this site without success.  Patient denies any fever or chills.  No active drainage from this lump.  No paresthesias, weakness, or numbness extending down the left arm.  Past Medical History:  Diagnosis Date  . Anemia    hx with pregnancy  . Anxiety   . Asthma    childhood, cold induced, no prob as adult, no inhaler  . Borderline diabetes   . Chlamydia   . Depression   . Headache(784.0)    otc med prn  . HPV (human papilloma virus) infection     Patient Active Problem List   Diagnosis Date Noted  . Anxiety 05/23/2011    Past Surgical History:  Procedure Laterality Date  . LAPAROSCOPIC TUBAL LIGATION  08/25/2011   Procedure: LAPAROSCOPIC TUBAL LIGATION;  Surgeon: Purcell NailsAngela Y Roberts, MD;  Location: [facility];  Service: Gynecology;  Laterality: Bilateral;  . NO PAST SURGERIES    . SVD     x 2 - 2008, 2012  . WISDOM TOOTH EXTRACTION  06/2011    OB History    Gravida Para Term Preterm AB Living   2 2 2  0 0 2   SAB TAB Ectopic Multiple Live Births   0 0 0 0 1       Home Medications    Prior to Admission medications   Medication Sig Start Date End Date Taking? Authorizing Provider  azithromycin (ZITHROMAX Z-PAK) 250 MG tablet Take 2 pills today, then 1 pill daily until gone. 10/16/15   Charm RingsHonig, Erin J, MD    cetirizine (ZYRTEC) 10 MG tablet Take 1 tablet (10 mg total) by mouth daily. 10/16/15   Charm RingsHonig, Erin J, MD  doxycycline (VIBRAMYCIN) 100 MG capsule Take 1 capsule (100 mg total) by mouth 2 (two) times daily for 5 days. 07/27/17 08/01/17  Aviva KluverMurray, Mellody Masri B, PA-C  fluticasone (FLONASE) 50 MCG/ACT nasal spray Place 2 sprays into both nostrils daily. 10/16/15   Charm RingsHonig, Erin J, MD  ibuprofen (ADVIL,MOTRIN) 600 MG tablet Take 1 tablet (600 mg total) by mouth every 6 (six) hours as needed. 07/27/17   Elisha PonderMurray, Vinaya Sancho B, PA-C    Family History Family History  Problem Relation Age of Onset  . Multiple sclerosis Mother   . Hypertension Mother   . Pulmonary embolism Father   . Hypertension Father   . Diabetes Father     Social History Social History   Tobacco Use  . Smoking status: Former Smoker    Packs/day: 0.25    Years: 2.00    Pack years: 0.50    Types: Cigarettes    Last attempt to quit: 08/28/2009    Years since quitting: 7.9  . Smokeless tobacco: Never Used  Substance Use Topics  . Alcohol use: No  . Drug  use: No     Allergies   Patient has no known allergies.   Review of Systems Review of Systems  Constitutional: Negative for chills and fever.  Skin: Positive for color change.  Neurological: Negative for weakness and numbness.     Physical Exam Updated Vital Signs BP 123/84 (BP Location: Right Arm)   Pulse 80   Temp 97.6 F (36.4 C) (Oral)   Resp 18   Ht 5\' 7"  (1.702 m)   Wt (!) 138.3 kg (305 lb)   LMP 06/26/2017   SpO2 100%   BMI 47.77 kg/m    Physical Exam  Constitutional: She appears well-developed and well-nourished. No distress.  Sitting comfortably in bed.  HENT:  Head: Normocephalic and atraumatic.  Eyes: Conjunctivae are normal. Right eye exhibits no discharge. Left eye exhibits no discharge.  EOMs normal to gross examination.  Neck: Normal range of motion. Neck supple.  Cardiovascular: Normal rate and regular rhythm.  Intact, 2+ radial pulse of LUE   Pulmonary/Chest:  Normal respiratory effort. Patient converses comfortably. No audible wheeze or stridor.  Abdominal: She exhibits no distension.  Musculoskeletal: Normal range of motion.  Lymphadenopathy:    She has no cervical adenopathy.  Neurological: She is alert.  Cranial nerves intact to gross observation. Patient moves extremities without difficulty.  Skin: Skin is warm and dry. She is not diaphoretic.  There is approximately 2 cm area of induration with overlying erythema in the left posterior neck.  No surrounding erythema.  Please see image for detail.  Psychiatric: She has a normal mood and affect. Her behavior is normal. Judgment and thought content normal.  Nursing note and vitals reviewed.      ED Treatments / Results  Labs (all labs ordered are listed, but only abnormal results are displayed) Labs Reviewed - No data to display  EKG  EKG Interpretation None       Radiology No results found.  Procedures .Marland Kitchen.Incision and Drainage  Date/Time: 07/28/2017 4:48 AM  Performed by: Elisha PonderMurray, Farrah Skoda B, PA-C  Authorized by: Elisha PonderMurray, Larina Lieurance B, PA-C   Consent:    Consent obtained:  Verbal   Consent given by:  Patient   Risks discussed:  Incomplete drainage   Alternatives discussed:  No treatment Location:    Type:  Abscess   Location:  Neck   Neck location:  L posterior Pre-procedure details:    Skin preparation:  Betadine Anesthesia (see MAR for exact dosages):    Anesthesia method:  Local infiltration   Local anesthetic:  Lidocaine 2% WITH epi Procedure type:    Complexity:  Simple Procedure details:    Incision types:  Single straight   Incision depth:  Dermal   Scalpel blade:  11   Wound management:  Probed and deloculated and irrigated with saline   Drainage characteristics: Purulence and sebum expressed.   Drainage amount:  Moderate   Wound treatment:  Wound left open   Packing materials:  None Post-procedure details:    Patient tolerance of  procedure:  Tolerated well, no immediate complications   (including critical care time)  EMERGENCY DEPARTMENT US SOFT TISSUE INTERPRETATION "Study: Limited Soft Tissue Ultrasound"  INDICATIONS: abscess Multiple views of the body part were obtained in real-time with a multi-frequency linear probe PERFORMED BY:  Myself IMAGES ARCHIVED?: No SIDE:Left BODY PART:Neck FINDINGS: Abcess present INTERPRETATION:  Abcess present  CPT: Neck [id number]  Upper extremity 76880-26  Axilla 76880-26  Chest wall [id number]  Beast [id number]  Upper back [id number]  Lower  back [id number]  Abdominal wall 09811-91  Pelvic wall 47829-56  Lower extremity [phone]  Other soft tissue 78469-62  Medications Ordered in ED Medications  lidocaine-EPINEPHrine (XYLOCAINE W/EPI) 2 %-1:200000 (PF) injection 10 mL (10 mLs Infiltration Given 07/27/17 2023)     Initial Impression / Assessment and Plan / ED Course  I have reviewed the triage vital signs and the nursing notes.  Pertinent labs & imaging results that were available during my care of the patient were reviewed by me and considered in my medical decision making (see chart for details).     Final Clinical Impressions(s) / ED Diagnoses   Final diagnoses:  Infected sebaceous cyst   Patient is nontoxic-appearing and in no acute distress.  The history and physical exam of this nodule of the left neck are consistent with infected sebaceous cyst.  Soft tissue ultrasound noted a fluid collection.  Incision and drainage performed and pus expressed as well as sebum and remnants of capsule of a sebaceous cyst.  Due to the location and the cyst, this was not fully excised.  I discussed with patient that retained capsule can grow into an additional cyst.  Patient to establish primary care and follow-up with referral to dermatology or general surgery if this were to happen again.  Doxycycline prescribed.   Instructed on post I&D care and warm compresses.  Return  precautions given for any increasing erythema, pain, or purulence.  Patient is in understanding and agrees with the plan of care.  ED Discharge Orders        Ordered    doxycycline (VIBRAMYCIN) 100 MG capsule  2 times daily     07/27/17 2210    ibuprofen (ADVIL,MOTRIN) 600 MG tablet  Every 6 hours PRN     07/27/17 2210      Elisha Ponder, PA-C 07/28/17 9528  Arby Barrette, MD 07/29/17 2256

## 2018-04-24 ENCOUNTER — Other Ambulatory Visit: Payer: Self-pay

## 2018-04-24 ENCOUNTER — Ambulatory Visit (HOSPITAL_COMMUNITY)
Admission: EM | Admit: 2018-04-24 | Discharge: 2018-04-24 | Disposition: A | Discharge status: Home / Self Care | Payer: Self-pay | Attending: Family Medicine | Admitting: Family Medicine

## 2018-04-24 ENCOUNTER — Encounter (HOSPITAL_COMMUNITY): Payer: Self-pay | Admitting: Emergency Medicine

## 2018-04-24 DIAGNOSIS — B029 Zoster without complications: Secondary | ICD-10-CM

## 2018-04-24 MED ORDER — NAPROXEN 500 MG PO TABS
500.0000 mg | ORAL_TABLET | Freq: Two times a day (BID) | ORAL | 0 refills | Status: DC
Start: 2018-04-24 — End: 2021-07-12

## 2018-04-24 MED ORDER — VALACYCLOVIR HCL 1 G PO TABS: 1000.0000 mg | ORAL_TABLET | Freq: Three times a day (TID) | ORAL | 0 refills | Status: AC

## 2018-04-24 MED ORDER — LIDOCAINE 5 % EX PTCH: 1.0000 | MEDICATED_PATCH | CUTANEOUS | 0 refills | Status: DC

## 2018-04-24 NOTE — ED Triage Notes (Signed)
Rash to center, right abdomen.  Patient has skin sensitivity around right torso.  Rash itched Monday, now painful.

## 2018-04-24 NOTE — ED Provider Notes (Signed)
[facility]   [id number] 04/24/18 Arrival Time: 0902  CC: Rash  SUBJECTIVE:  [name] [name] is a 32 y.o. female who presents with a rash to abdomen that began 2 days ago.  Denies changes in soaps, detergents, or anyone with similar symptoms.  Denies known trigger, environmental exposure or allergies, or recent travel.  Localizes the rash to right abdomen with radiating symptoms towards the right mid back.  Describes it as itchy initially and now painful.  Has tried antibiotic cream as well as benadryl without relief.  Symptoms are made worse with increased activities.  Denies similar symptoms in the past.   Complains fatigue, and subjective fever.  Denies chills, nausea, vomiting,SOB, chest pain, abdominal pain, changes in bowel or bladder function.    ROS: As per HPI.  Past Medical History:  Diagnosis Date  . Anemia    hx with pregnancy  . Anxiety   . Asthma    childhood, cold induced, no prob as adult, no inhaler  . Borderline diabetes   . Chlamydia   . Depression   . Headache(784.0)    otc med prn  . HPV (human papilloma virus) infection    Past Surgical History:  Procedure Laterality Date  . LAPAROSCOPIC TUBAL LIGATION  08/25/2011   Procedure: LAPAROSCOPIC TUBAL LIGATION;  Surgeon: Purcell Nails, MD;  Location: [facility];  Service: Gynecology;  Laterality: Bilateral;  . NO PAST SURGERIES    . SVD     x 2 - 2008, 2012  . WISDOM TOOTH EXTRACTION  06/2011   No Known Allergies No current facility-administered medications on file prior to encounter.    No current outpatient medications on file prior to encounter.   Social History   Socioeconomic History  . Marital status: Married    Spouse name: Not on file  . Number of children: 1  . Years of education: Not on file  . Highest education level: Not on file  Occupational History  . Occupation: housewife  Social Needs  . Financial resource strain: Not on file  . Food insecurity:    Worry: Not on file   Inability: Not on file  . Transportation needs:    Medical: Not on file    Non-medical: Not on file  Tobacco Use  . Smoking status: Former Smoker    Packs/day: 0.25    Years: 2.00    Pack years: 0.50    Types: Cigarettes    Last attempt to quit: 08/28/2009    Years since quitting: 8.6  . Smokeless tobacco: Never Used  Substance and Sexual Activity  . Alcohol use: Yes  . Drug use: No  . Sexual activity: Yes    Birth control/protection: Surgical  Lifestyle  . Physical activity:    Days per week: Not on file    Minutes per session: Not on file  . Stress: Not on file  Relationships  . Social connections:    Talks on phone: Not on file    Gets together: Not on file    Attends religious service: Not on file    Active member of club or organization: Not on file    Attends meetings of clubs or organizations: Not on file    Relationship status: Not on file  . Intimate partner violence:    Fear of current or ex partner: Not on file    Emotionally abused: Not on file    Physically abused: Not on file    Forced sexual activity: Not on  file  Other Topics Concern  . Not on file  Social History Narrative  . Not on file   Family History  Problem Relation Age of Onset  . Multiple sclerosis Mother   . Hypertension Mother   . Pulmonary embolism Father   . Hypertension Father   . Diabetes Father     OBJECTIVE: Vitals:   04/24/18 0929  BP: (!) 123/53  Pulse: 64  Resp: 20  Temp: 97.7 F (36.5 C)  TempSrc: Oral  SpO2: 97%    General appearance: alert; no distress Lungs: clear to auscultation bilaterally Heart: regular rate and rhythm.  Radial pulse 2+ bilaterally Extremities: no edema Skin: warm and dry; erythematous vesicular rash in clusters localized to right side of the abdomen, no active drainage, tender to palpation, does not cross midline, no obvious involvement of back Psychological: alert and cooperative; normal mood and affect  ASSESSMENT & PLAN:  1. Herpes  zoster without complication     Meds ordered this encounter  Medications  . valACYclovir (VALTREX) 1000 MG tablet    Sig: Take 1 tablet (1,000 mg total) by mouth 3 (three) times daily for 10 days.    Dispense:  30 tablet    Refill:  0    Order Specific Question:   Supervising Provider    Answer:   Isa RankinMURRAY, LAURA WILSON [phone][[id number]]  . naproxen (NAPROSYN) 500 MG tablet    Sig: Take 1 tablet (500 mg total) by mouth 2 (two) times daily.    Dispense:  30 tablet    Refill:  0    Order Specific Question:   Supervising Provider    Answer:   Isa RankinMURRAY, LAURA WILSON [phone][[id number]]  . lidocaine (LIDODERM) 5 %    Sig: Place 1 patch onto the skin daily. Remove & Discard patch within 12 hours or as directed by MD    Dispense:  30 patch    Refill:  0    Order Specific Question:   Supervising Provider    Answer:   Isa RankinMURRAY, LAURA WILSON [phone][[id number]]   Rest and use ice/heat as needed for symptomatic relief Prescribed valacyclovir 1000mg  3x/day for 10 days Prescribed naproxen as needed for pain Lidocaine patches as needed for pain Follow up with PCP in 7-10 days if rash is still present Follow up with PCP if symptoms of burning, stinging, tingling or numbness occur after rash resolves, you may need additional treatment Return here or go to ER if you have any new or worsening symptoms (such as eye involvement, severe pain, or signs of secondary infection such as fever, chills, nausea, vomiting, discharge, redness or warmth over site of rash)   Reviewed expectations re: course of current medical issues. Questions answered. Outlined signs and symptoms indicating need for more acute intervention. Patient verbalized understanding. After Visit Summary given.   Rennis HardingWurst, Jeffre Enriques, PA-C 04/24/18 1008

## 2018-04-24 NOTE — Discharge Instructions (Addendum)
Rest and use ice/heat as needed for symptomatic relief Prescribed valacyclovir 1000mg  3x/day for 10 days Prescribed naproxen as needed for pain Lidocaine patches as needed for pain Follow up with PCP in 7-10 days if rash is still present Follow up with PCP if symptoms of burning, stinging, tingling or numbness occur after rash resolves, you may need additional treatment Return here or go to ER if you have any new or worsening symptoms (such as eye involvement, severe pain, or signs of secondary infection such as fever, chills, nausea, vomiting, discharge, redness or warmth over site of rash)

## 2019-07-16 ENCOUNTER — Other Ambulatory Visit: Payer: Self-pay

## 2019-07-16 DIAGNOSIS — Z20822 Contact with and (suspected) exposure to covid-19: Secondary | ICD-10-CM

## 2019-07-18 LAB — NOVEL CORONAVIRUS, NAA: SARS-CoV-2, NAA: NOT DETECTED

## 2019-07-22 ENCOUNTER — Other Ambulatory Visit: Payer: Self-pay

## 2019-07-22 ENCOUNTER — Encounter (HOSPITAL_COMMUNITY): Payer: Self-pay | Admitting: Emergency Medicine

## 2019-07-22 ENCOUNTER — Emergency Department (HOSPITAL_COMMUNITY)
Admission: EM | Admit: 2019-07-22 | Discharge: 2019-07-22 | Disposition: A | Discharge status: Home / Self Care | Payer: Self-pay | Attending: Emergency Medicine | Admitting: Emergency Medicine

## 2019-07-22 DIAGNOSIS — J36 Peritonsillar abscess: Secondary | ICD-10-CM | POA: Insufficient documentation

## 2019-07-22 DIAGNOSIS — Z87891 Personal history of nicotine dependence: Secondary | ICD-10-CM | POA: Insufficient documentation

## 2019-07-22 MED ORDER — AMOXICILLIN-POT CLAVULANATE 875-125 MG PO TABS: 1.0000 | ORAL_TABLET | Freq: Two times a day (BID) | ORAL | 0 refills | Status: DC

## 2019-07-22 MED ORDER — DEXAMETHASONE 4 MG PO TABS
10.0000 mg | ORAL_TABLET | Freq: Once | ORAL | Status: AC
  Administered 2019-07-22: 10 mg via ORAL
  Filled 2019-07-22: qty 3

## 2019-07-22 NOTE — ED Triage Notes (Signed)
Pt here from home with c/o swelling to her right side of mouth , pt sttes that she had a blister inside of her mouth that she busted yesterday and since that time her mouth has hurt

## 2019-07-22 NOTE — Discharge Instructions (Signed)
Take Augmentin twice daily for one week Take Ibuprofen or Tylenol for pain Please return if worsening

## 2019-07-22 NOTE — ED Notes (Signed)
Patient verbalized understanding of dc instructions, vss, ambulatory with nad.   

## 2019-07-22 NOTE — ED Provider Notes (Signed)
[facility] EMERGENCY DEPARTMENT Provider Note   CSN: [id number] Arrival date & time: 07/22/19  3151     History   Chief Complaint No chief complaint on file.   HPI [name] [name] is a 33 y.o. female who presents with throat swelling.  She states that she has had a focal area of swelling over the back of her mouth on the right lower side for several years after she had her wisdom teeth pulled.  2 days ago she was "bored" and took one of her husband diabetic lancets and poked the area.  This caused it to drain and the swelling went down.  Yesterday she was gardening and started to feel like she had a sore throat and so she started to drink some fluids.  The pain and swelling has gradually worsened.  Today she woke up and she had significant swelling, pain, and some difficulty swallowing.  She took 800 mg of ibuprofen prior to arrival.  She denies fevers, malaise, inability to swallow or difficulty breathing     HPI  Past Medical History:  Diagnosis Date  . Anemia    hx with pregnancy  . Anxiety   . Asthma    childhood, cold induced, no prob as adult, no inhaler  . Borderline diabetes   . Chlamydia   . Depression   . Headache(784.0)    otc med prn  . HPV (human papilloma virus) infection     Patient Active Problem List   Diagnosis Date Noted  . Anxiety 05/23/2011    Past Surgical History:  Procedure Laterality Date  . LAPAROSCOPIC TUBAL LIGATION  08/25/2011   Procedure: LAPAROSCOPIC TUBAL LIGATION;  Surgeon: Delice Lesch, MD;  Location: [facility];  Service: Gynecology;  Laterality: Bilateral;  . NO PAST SURGERIES    . SVD     x 2 - 2008, 2012  . WISDOM TOOTH EXTRACTION  06/2011     OB History    Gravida  2   Para  2   Term  2   Preterm  0   AB  0   Living  2     SAB  0   TAB  0   Ectopic  0   Multiple  0   Live Births  1            Home Medications    Prior to Admission medications   Medication Sig Start Date End  Date Taking? Authorizing Provider  lidocaine (LIDODERM) 5 % Place 1 patch onto the skin daily. Remove & Discard patch within 12 hours or as directed by MD 04/24/18   Stacey Drain, [place], PA-C  naproxen (NAPROSYN) 500 MG tablet Take 1 tablet (500 mg total) by mouth 2 (two) times daily. 04/24/18   Lestine Box, PA-C    Family History Family History  Problem Relation Age of Onset  . Multiple sclerosis Mother   . Hypertension Mother   . Pulmonary embolism Father   . Hypertension Father   . Diabetes Father     Social History Social History   Tobacco Use  . Smoking status: Former Smoker    Packs/day: 0.25    Years: 2.00    Pack years: 0.50    Types: Cigarettes    Quit date: 08/28/2009    Years since quitting: 9.9  . Smokeless tobacco: Never Used  Substance Use Topics  . Alcohol use: Yes  . Drug use: No     Allergies   Patient  has no known allergies.   Review of Systems Review of Systems  Constitutional: Negative for fever.  HENT: Positive for sore throat.        +throat swelling     Physical Exam Updated Vital Signs BP 137/84 (BP Location: Right Arm)   Pulse 88   Temp 98 F (36.7 C) (Oral)   Resp 18   SpO2 98%   Physical Exam Vitals signs and nursing note reviewed.  Constitutional:      General: She is not in acute distress.    Appearance: She is well-developed. She is obese. She is not ill-appearing.  HENT:     Head: Normocephalic and atraumatic.     Mouth/Throat:     Lips: Pink.     Mouth: Mucous membranes are moist.     Pharynx: Pharyngeal swelling (significant right sided swelling of the peritonsillar area with deviation of the uvula) present.     Comments: Mild-mod trismus Eyes:     General: No scleral icterus.       Right eye: No discharge.        Left eye: No discharge.     Conjunctiva/sclera: Conjunctivae normal.     Pupils: Pupils are equal, round, and reactive to light.  Neck:     Musculoskeletal: Normal range of motion.  Cardiovascular:      Rate and Rhythm: Normal rate.  Pulmonary:     Effort: Pulmonary effort is normal. No respiratory distress.  Abdominal:     General: There is no distension.  Skin:    General: Skin is warm and dry.  Neurological:     Mental Status: She is alert and oriented to person, place, and time.  Psychiatric:        Behavior: Behavior normal.      ED Treatments / Results  Labs (all labs ordered are listed, but only abnormal results are displayed) Labs Reviewed - No data to display  EKG None  Radiology No results found.  Procedures Procedures (including critical care time)  Medications Ordered in ED Medications - No data to display   Initial Impression / Assessment and Plan / ED Course  I have reviewed the triage vital signs and the nursing notes.  Pertinent labs & imaging results that were available during my care of the patient were reviewed by me and considered in my medical decision making (see chart for details).  33 year old female presents with acute right-sided throat swelling after using a lancet to poke a blister that she has had in her mouth for several years.  Her vital signs are normal.  She has significant swelling that suggests peritonsillar abscess however somewhat of an atypical presentation due to the rapid swelling.  Recommended CT of the neck to further assess and medications however patient is refusing at this time and states she would just like to try meds.  She is alert, oriented, has capacity, has normal vital signs, and has tolerated p.o. here.  She was given a dose of Decadron.  She is given prescription for Augmentin and advised to return if worsening.  Final Clinical Impressions(s) / ED Diagnoses   Final diagnoses:  Peritonsillar abscess    ED Discharge Orders    None       Bethel Born, PA-C 07/22/19 1114    Virgina Norfolk, DO 07/22/19 1552

## 2020-09-10 ENCOUNTER — Other Ambulatory Visit: Payer: Self-pay

## 2021-07-12 ENCOUNTER — Encounter (HOSPITAL_COMMUNITY): Payer: Self-pay

## 2021-07-12 ENCOUNTER — Ambulatory Visit (HOSPITAL_COMMUNITY)
Admission: EM | Admit: 2021-07-12 | Discharge: 2021-07-12 | Disposition: A | Discharge status: Home / Self Care | Payer: PRIVATE HEALTH INSURANCE | Attending: Emergency Medicine | Admitting: Emergency Medicine

## 2021-07-12 ENCOUNTER — Ambulatory Visit: Payer: Self-pay

## 2021-07-12 ENCOUNTER — Encounter: Payer: Self-pay | Admitting: Family Medicine

## 2021-07-12 ENCOUNTER — Other Ambulatory Visit: Payer: Self-pay

## 2021-07-12 ENCOUNTER — Ambulatory Visit (INDEPENDENT_AMBULATORY_CARE_PROVIDER_SITE_OTHER): Payer: PRIVATE HEALTH INSURANCE | Admitting: Family Medicine

## 2021-07-12 VITALS — BP 117/83 | HR 69 | Ht 67.0 in | Wt 305.0 lb

## 2021-07-12 DIAGNOSIS — M722 Plantar fascial fibromatosis: Secondary | ICD-10-CM | POA: Insufficient documentation

## 2021-07-12 DIAGNOSIS — M79671 Pain in right foot: Secondary | ICD-10-CM

## 2021-07-12 DIAGNOSIS — E282 Polycystic ovarian syndrome: Secondary | ICD-10-CM | POA: Insufficient documentation

## 2021-07-12 DIAGNOSIS — M79672 Pain in left foot: Secondary | ICD-10-CM

## 2021-07-12 MED ORDER — NAPROXEN 375 MG PO TABS: 375.0000 mg | ORAL_TABLET | Freq: Two times a day (BID) | ORAL | 0 refills | Status: DC

## 2021-07-12 MED ORDER — CELECOXIB 100 MG PO CAPS: 100.0000 mg | ORAL_CAPSULE | Freq: Two times a day (BID) | ORAL | 1 refills | Status: DC | PRN

## 2021-07-12 NOTE — ED Provider Notes (Signed)
[facility]    CSN: [id number] Arrival date & time: 07/12/21  0849      History   Chief Complaint Chief Complaint  Patient presents with   Foot Pain    HPI [name] [name] is a 35 y.o. female. Pt reports hx of B heel pain years ago. Treated with silicone heel inserts, compression stockings, stretching and symptoms improved. In September of this year, pt was ill and in bed for a week. After this prolonged bedrest, she developed heel pain again and prior treatments that worked have not helped this time. Symptoms worse in the morning when she wakes up, get better gradually with movement throughout the day, and then feel worse at night after being on her feet all day at the end of the day. Has only taken ibuprofen on occasion and feels it hasn't helped.    Foot Pain   Past Medical History:  Diagnosis Date   Anemia    hx with pregnancy   Anxiety    Asthma    childhood, cold induced, no prob as adult, no inhaler   Borderline diabetes    Chlamydia    Depression    Headache(784.0)    otc med prn   HPV (human papilloma virus) infection     Patient Active Problem List   Diagnosis Date Noted   Anxiety 05/23/2011    Past Surgical History:  Procedure Laterality Date   LAPAROSCOPIC TUBAL LIGATION  08/25/2011   Procedure: LAPAROSCOPIC TUBAL LIGATION;  Surgeon: Purcell Nails, MD;  Location: [facility];  Service: Gynecology;  Laterality: Bilateral;   NO PAST SURGERIES     SVD     x 2 - 2008, 2012   WISDOM TOOTH EXTRACTION  06/2011    OB History     Gravida  2   Para  2   Term  2   Preterm  0   AB  0   Living  2      SAB  0   IAB  0   Ectopic  0   Multiple  0   Live Births  1            Home Medications    Prior to Admission medications   Medication Sig Start Date End Date Taking? Authorizing Provider  naproxen (NAPROSYN) 375 MG tablet Take 1 tablet (375 mg total) by mouth 2 (two) times daily. 07/12/21  Yes Cathlyn Parsons, NP     Family History Family History  Problem Relation Age of Onset   Multiple sclerosis Mother    Hypertension Mother    Pulmonary embolism Father    Hypertension Father    Diabetes Father     Social History Social History   Tobacco Use   Smoking status: Former    Packs/day: 0.25    Years: 2.00    Pack years: 0.50    Types: Cigarettes    Quit date: 08/28/2009    Years since quitting: 11.8   Smokeless tobacco: Never  Substance Use Topics   Alcohol use: Yes   Drug use: No     Allergies   Patient has no known allergies.   Review of Systems Review of Systems   Physical Exam Triage Vital Signs ED Triage Vitals  Enc Vitals Group     BP 07/12/21 1006 117/83     Pulse Rate 07/12/21 1006 69     Resp 07/12/21 1006 18     Temp 07/12/21 1006 98.3 F (36.8  C)     Temp Source 07/12/21 1006 Oral     SpO2 07/12/21 1006 98 %     Weight --      Height --      Head Circumference --      Peak Flow --      Pain Score 07/12/21 1004 8     Pain Loc --      Pain Edu? --      Excl. in GC? --    No data found.  Updated Vital Signs BP 117/83 (BP Location: Right Arm)   Pulse 69   Temp 98.3 F (36.8 C) (Oral)   Resp 18   LMP 06/15/2021 (Exact Date)   SpO2 98%   Visual Acuity Right Eye Distance:   Left Eye Distance:   Bilateral Distance:    Right Eye Near:   Left Eye Near:    Bilateral Near:     Physical Exam Constitutional:      General: She is not in acute distress.    Appearance: Normal appearance. She is obese. She is not ill-appearing.  Pulmonary:     Effort: Pulmonary effort is normal.  Musculoskeletal:     Comments: Elicited pain in B feet after dorsiflexing pt toes and palpating along the fascia from the heel to the forefoot  Neurological:     Mental Status: She is alert.     UC Treatments / Results  Labs (all labs ordered are listed, but only abnormal results are displayed) Labs Reviewed - No data to display  EKG   Radiology No results  found.  Procedures Procedures (including critical care time)  Medications Ordered in UC Medications - No data to display  Initial Impression / Assessment and Plan / UC Course  I have reviewed the triage vital signs and the nursing notes.  Pertinent labs & imaging results that were available during my care of the patient were reviewed by me and considered in my medical decision making (see chart for details).    Pt to continue supportive measures for plantar fascitis including silicone heel inserts. Recommended pt try ibuprofen on a schedule but pt reports she doesn't tolerate ibuprofen well - causes stomach upset. Discussed options, rx naproxen, pt to take with food. Given stretching exercises. Referred to sports medicine.   Final Clinical Impressions(s) / UC Diagnoses   Final diagnoses:  Plantar fasciitis, bilateral     Discharge Instructions      Try taking ibuprofen on a schedule for a week or 2.  Try doing the plantar fascitis exercises daily. Keep wearing the silicone heel inserts in your shoes. Follow up with sports medicine.      ED Prescriptions     Medication Sig Dispense Auth. Provider   naproxen (NAPROSYN) 375 MG tablet Take 1 tablet (375 mg total) by mouth 2 (two) times daily. 30 tablet Cathlyn Parsons, NP      PDMP not reviewed this encounter.   Cathlyn Parsons, NP 07/12/21 1044

## 2021-07-12 NOTE — Discharge Instructions (Addendum)
Try taking ibuprofen on a schedule for a week or 2.  Try doing the plantar fascitis exercises daily. Keep wearing the silicone heel inserts in your shoes. Follow up with sports medicine.

## 2021-07-12 NOTE — Patient Instructions (Addendum)
Nice to meet you today.  Wear a night splint at night while sleeping.  Eccentric heel lowers like Dr. Denyse Amass showed you.  Take 5-7 sec to lower your heels from a raised position  Celebrex  PCP options: Lawerance Bach or [employer] or Rinaldo Cloud or Arlyss Repress Alred at [facility] [address] [place], [place] [address] Phone: [phone]   Follow-up: one month

## 2021-07-12 NOTE — ED Triage Notes (Signed)
Pt reports bilateral heel pain. 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

## 2021-07-12 NOTE — Progress Notes (Signed)
I, [name] [name], LAT, ATC, am serving as scribe for Dr. Clementeen Graham.  Subjective:    CC: Bilat foot pain  HPI: Pt is a 35 y/o female c/o bilat foot pain ongoing for years that worsened in Sept after a period of bedrest due to illness. Pt was seen at the [facility] early today for this issue. Pt locates pain to her B plantar heels, L>R.  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 She has tried Ibuprofen but it makes her stomach upset.   Foot swelling: no Aggravates: worse in the morning, after being on her feet all day Treatments tried: silicone heel inserts, compression stockings, stretching, IBU; arch supports  Pertinent review of Systems: No fevers or chills  Relevant historical information: PCOS and obesity.  Her father died last week after a protracted hospitalization after a fall in September.   Objective:    Vitals:   07/12/21 1244  BP: 117/83  Pulse: 69  SpO2: 98%   General: Well Developed, well nourished, and in no acute distress.   MSK: Right foot normal-appearing Tender palpation plantar calcaneus.  Normal foot and ankle motion.  Left foot normal-appearing Tender palpation plantar calcaneus.  Normal foot and ankle motion.  Lab and Radiology Results  Diagnostic Limited MSK Ultrasound of: Bilateral plantar calcaneus Right plantar calcaneus: Plantar fascial slightly thickened measuring 5 mm.  Heel spur present.  No tear present Left plantar calcaneus: Plantar fascia is slightly thickened measuring 5 mm.  Heel spur present.  No tear present. Impression: Bilateral plantar fasciitis with heel spur.     Impression and Recommendations:    Assessment and Plan: 35 y.o. female with bilateral plantar fasciitis left worse than right.  This is a chronic issue with exacerbation ongoing since September.  She is doing a fair amount of conservative management already including cushioning, oral NSAIDs, and some icing.  However she certainly  has room for optimization.  Plan to include eccentric heel exercises, ice massage, and night splint.  Discussed injection.  Would very much like to avoid that if possible.  Additionally if ibuprofen is causing stomach upset we will try Celebrex at low-dose.  Check back in about a month.  Additionally spent some time talking about more global issues.  She recognizes her obesity is a factor here.  PCOS is certainly impacting her ability to lose weight and I think she is an excellent candidate for [facility] and recommend that she call them and get on the wait list.  Additionally she would benefit from a PCP who will help her with her overall medical problems as well as PCOS.  Provided her with a list of recommendations for PCPs.  PDMP not reviewed this encounter. Orders Placed This Encounter  Procedures   [place] LIMITED JOINT SPACE STRUCTURES LOW BILAT(NO LINKED CHARGES)    Order Specific Question:   Reason for Exam (SYMPTOM  OR DIAGNOSIS REQUIRED)    Answer:   B heel pain    Order Specific Question:   Preferred imaging location?    Answer:   [facility] ordered this encounter  Medications   celecoxib (CELEBREX) 100 MG capsule    Sig: Take 1 capsule (100 mg total) by mouth 2 (two) times daily as needed.    Dispense:  60 capsule    Refill:  1    Discussed warning signs or symptoms. Please see discharge instructions. Patient expresses  understanding.   The above documentation has been reviewed and is accurate and complete Lynne Leader, M.D.

## 2021-08-09 ENCOUNTER — Other Ambulatory Visit: Payer: Self-pay

## 2021-08-09 ENCOUNTER — Ambulatory Visit (INDEPENDENT_AMBULATORY_CARE_PROVIDER_SITE_OTHER): Payer: PRIVATE HEALTH INSURANCE | Admitting: Family Medicine

## 2021-08-09 VITALS — BP 118/80 | HR 87 | Ht 67.0 in | Wt 296.2 lb

## 2021-08-09 DIAGNOSIS — M722 Plantar fascial fibromatosis: Secondary | ICD-10-CM

## 2021-08-09 NOTE — Progress Notes (Signed)
   I, Philbert Riser, LAT, ATC acting as a scribe for Clementeen Graham, MD.  [name] [name] is a 35 y.o. female who presents to [facility] today for f/u bilat plantar fascitis, L>R. Pt works as a Manufacturing systems engineer. Pt was last seen by Dr. Denyse Amass on 07/12/21 and was advised to work on eccentric heel exercises, ice massage, and night splint. Today, pt reports bilat feet are no better. Pt notes she had been compliant w/ all of the recommendations, but feels no improvement. Pt states that the foot pain is inhibiting her from doing the activities she wants to do w/ her family.  Celebrex causes stomach upset.  Pertinent review of systems: No fevers or chills  Relevant historical information: Obesity   Exam:  BP 118/80   Pulse 87   Ht 5\' 7"  (1.702 m)   Wt 296 lb 3.2 oz (134.4 kg)   SpO2 98%   BMI 46.39 kg/m  General: Well Developed, well nourished, and in no acute distress.   MSK: Right foot normal-appearing Tender palpation plantar calcaneus.  Normal foot and ankle motion and strength.   left foot normal-appearing Normal motion.  Tender palpation plantar calcaneus.  Normal foot and ankle motion and strength.     Assessment and Plan: 35 y.o. female with bilateral plantar fasciitis.  Discussed treatment plan and options.  Discussed potential injections.  She would very much like to avoid injections and will continue conservative measures strategies.  Reviewed her footwear and had some thoughts about different types that she is to use.  Recommend trying Voltaren gel.  Check back as needed. Steroid injection likely next step.   Discussed warning signs or symptoms. Please see discharge instructions. Patient expresses understanding.   The above documentation has been reviewed and is accurate and complete 31, M.D.

## 2021-08-09 NOTE — Patient Instructions (Addendum)
Thank you for coming in today.   Continue working on home exercises.  Ice massage  Please use Voltaren gel (Generic Diclofenac Gel) up to 4x daily for pain as needed.  This is available over-the-counter as both the name brand Voltaren gel and the generic diclofenac gel.   Fleet feet  Recheck back as needed

## 2022-11-19 ENCOUNTER — Emergency Department (HOSPITAL_BASED_OUTPATIENT_CLINIC_OR_DEPARTMENT_OTHER)
Admission: EM | Admit: 2022-11-19 | Discharge: 2022-11-19 | Disposition: A | Discharge status: Home / Self Care | Payer: Self-pay | Attending: Emergency Medicine | Admitting: Emergency Medicine

## 2022-11-19 ENCOUNTER — Emergency Department (HOSPITAL_BASED_OUTPATIENT_CLINIC_OR_DEPARTMENT_OTHER): Payer: Self-pay

## 2022-11-19 ENCOUNTER — Other Ambulatory Visit: Payer: Self-pay

## 2022-11-19 DIAGNOSIS — R001 Bradycardia, unspecified: Secondary | ICD-10-CM

## 2022-11-19 DIAGNOSIS — R55 Syncope and collapse: Secondary | ICD-10-CM

## 2022-11-19 DIAGNOSIS — R519 Headache, unspecified: Secondary | ICD-10-CM

## 2022-11-19 LAB — BASIC METABOLIC PANEL
Anion gap: 7 (ref 5–15)
BUN: 21 mg/dL — ABNORMAL HIGH (ref 6–20)
CO2: 27 mmol/L (ref 22–32)
Calcium: 9.5 mg/dL (ref 8.9–10.3)
Chloride: 106 mmol/L (ref 98–111)
Creatinine, Ser: 0.77 mg/dL (ref 0.44–1.00)
GFR, Estimated: >60 mL/min (ref >60)
Glucose, Bld: 106 mg/dL — ABNORMAL HIGH (ref 70–99)
Potassium: 3.8 mmol/L (ref 3.5–5.1)
Sodium: 140 mmol/L (ref 135–145)

## 2022-11-19 LAB — HEPATIC FUNCTION PANEL
ALT: 14 U/L (ref 0–44)
AST: 15 U/L (ref 15–41)
Albumin: 3.7 g/dL (ref 3.5–5.0)
Alkaline Phosphatase: 57 U/L (ref 38–126)
Bilirubin, Direct: <0.1 mg/dL (ref 0.0–0.2)
Total Bilirubin: 0.2 mg/dL — ABNORMAL LOW (ref 0.3–1.2)
Total Protein: 7.5 g/dL (ref 6.5–8.1)

## 2022-11-19 LAB — CBC
HCT: 37.9 % (ref 36.0–46.0)
Hemoglobin: 12.1 g/dL (ref 12.0–15.0)
MCH: 26.5 pg (ref 26.0–34.0)
MCHC: 31.9 g/dL (ref 30.0–36.0)
MCV: 83.1 fL (ref 80.0–100.0)
Platelets: 334 10*3/uL (ref 150–400)
RBC: 4.56 MIL/uL (ref 3.87–5.11)
RDW: 13.9 % (ref 11.5–15.5)
WBC: 10.1 10*3/uL (ref 4.0–10.5)
nRBC: 0 % (ref 0.0–0.2)

## 2022-11-19 LAB — PREGNANCY, URINE: Preg Test, Ur: NEGATIVE

## 2022-11-19 NOTE — Discharge Instructions (Signed)
Thank you for letting us take care of you today.  Overall, your workup was reassuring including your EKG, blood work, and the CT scan of your head. We did not find any significant findings to explain your symptoms today. Your heart rate is mildly slow. For some people especially those who are active and healthy, this is normal. We do not see any other significant abnormalities on your EKG today. This may be normal for you but I do recommend you follow up with primary care to establish care for any chronic conditions and discuss if you should have an evaluation by cardiology for this. Sometimes they want you to wear a monitor to record your heart rate and rhythm and make sure there is no underlying issue.  I provided 2 clinics you may follow up with if you would like or you may go to a PCP of your own choosing. For any new or worsening symptoms such as chest pain, shortness of breath, loss of consciousness, or other new, concerning symptoms, please return to nearest emergency department for re-evaluation.

## 2022-11-21 NOTE — ED Provider Notes (Signed)
[facility] Provider Note   CSN: [id number] Arrival date & time: 11/19/22  1850     History  Chief Complaint  Patient presents with   Bradycardia   Headache    [name] [name] is a 37 y.o. female with PMH PCOS presents to ED c/o episode earlier today where she was at church and had severe pain in her neck and head. Pt reports she was at a church function working outside but not doing any strenuous activity when she felt ill, went to bathroom to have a bowel movement, and then had onset of this pain. She reports severe pain lasted a few minutes with almost near-syncopal like sensation and her mom became concerned because she had a heart rate in the 50s and recommended she come to ED for evaluation. She reports lingering headache though much improved following Tylenol taken before arrival and mild in nature during ED visit today. She denies vision changes, focal weakness, chest pain, abdominal pain, shortness of breath, nausea, vomiting, or diarrhea. She denies cough, congestion, fever, or recent illness. She reports she was not straining during bowel movement. Pt reports she is very active at baseline. Unsure what normal resting heart rate is. She denies a history of heart problems or syncope. She denies family history of CAD, arrhythmia, or sudden death. Her mom does have MS but pt has not previous had any symptoms concerning for this disease.      Home Medications None  Allergies    Patient has no known allergies.    Review of Systems   Review of Systems  All other systems reviewed and are negative.   Physical Exam Updated Vital Signs BP 114/68   Pulse (!) 56   Temp 98.2 F (36.8 C) (Temporal)   Resp 10   Ht 5\' 7"  (1.702 m)   Wt 131.5 kg   SpO2 99%   BMI 45.42 kg/m  Physical Exam Vitals and nursing note reviewed.  Constitutional:      General: She is not in acute distress.    Appearance: Normal appearance. She is not  ill-appearing, toxic-appearing or diaphoretic.  HENT:     Head: Normocephalic and atraumatic.     Mouth/Throat:     Mouth: Mucous membranes are moist.     Pharynx: Oropharynx is clear.  Eyes:     Extraocular Movements: Extraocular movements intact.     Conjunctiva/sclera: Conjunctivae normal.     Pupils: Pupils are equal, round, and reactive to light.  Cardiovascular:     Rate and Rhythm: Regular rhythm. Bradycardia present.     Heart sounds: Normal heart sounds. No murmur heard.    No friction rub. No gallop.  Pulmonary:     Effort: Pulmonary effort is normal. No respiratory distress.     Breath sounds: Normal breath sounds. No stridor. No wheezing, rhonchi or rales.  Chest:     Chest wall: No tenderness.  Abdominal:     General: Abdomen is flat. There is no distension.     Palpations: Abdomen is soft. There is no hepatomegaly, splenomegaly, mass or pulsatile mass.     Tenderness: There is no abdominal tenderness. There is no guarding.  Musculoskeletal:        General: No swelling. Normal range of motion.     Cervical back: Normal range of motion and neck supple. No rigidity.     Right lower leg: No edema.     Left lower leg: No edema.  Lymphadenopathy:  Cervical: No cervical adenopathy.  Skin:    General: Skin is warm and dry.     Capillary Refill: Capillary refill takes less than 2 seconds.     Findings: No rash.  Neurological:     Mental Status: She is alert and oriented to person, place, and time.     GCS: GCS eye subscore is 4. GCS verbal subscore is 5. GCS motor subscore is 6.     Cranial Nerves: Cranial nerves 2-12 are intact. No cranial nerve deficit, dysarthria or facial asymmetry.     Sensory: Sensation is intact.     Motor: Motor function is intact.     Coordination: Coordination is intact.  Psychiatric:        Mood and Affect: Mood and affect normal.        Speech: Speech normal.        Behavior: Behavior normal. Behavior is cooperative.        Thought  Content: Thought content normal.        Judgment: Judgment normal.     ED Results / Procedures / Treatments   Labs (all labs ordered are listed, but only abnormal results are displayed) Labs Reviewed  BASIC METABOLIC PANEL - Abnormal; Notable for the following components:      Result Value   Glucose, Bld 106 (*)    BUN 21 (*)    All other components within normal limits  HEPATIC FUNCTION PANEL - Abnormal; Notable for the following components:   Total Bilirubin 0.2 (*)    All other components within normal limits  CBC  PREGNANCY, URINE    EKG EKG Interpretation  Date/Time:  Sunday November 19 2022 19:48:36 EDT Ventricular Rate:  59 PR Interval:  155 QRS Duration: 97 QT Interval:  433 QTC Calculation: 429 R Axis:   83 Text Interpretation: Sinus arrhythmia Low voltage, precordial leads No significant change since last tracing Confirmed by Gareth Morgan [phone]) on 11/20/2022 3:13:18 PM  Radiology CT Head Wo Contrast  Result Date: 11/19/2022 CLINICAL DATA:  Syncope/presyncope, cerebrovascular cause suspected EXAM: CT HEAD WITHOUT CONTRAST TECHNIQUE: Contiguous axial images were obtained from the base of the skull through the vertex without intravenous contrast. RADIATION DOSE REDUCTION: This exam was performed according to the departmental dose-optimization program which includes automated exposure control, adjustment of the mA and/or kV according to patient size and/or use of iterative reconstruction technique. COMPARISON:  None Available. FINDINGS: Brain: No evidence of large-territorial acute infarction. No parenchymal hemorrhage. No mass lesion. No extra-axial collection. No mass effect or midline shift. No hydrocephalus. Basilar cisterns are patent. Vascular: No hyperdense vessel. Skull: No acute fracture or focal lesion. Sinuses/Orbits: Almost complete opacification of bilateral maxillary sinuses. Otherwise paranasal sinuses and mastoid air cells are clear. The orbits are  unremarkable. Other: None. IMPRESSION: 1. No acute intracranial abnormality. 2. Bilateral maxillary sinus disease. Electronically Signed   By: Iven Finn M.D.   On: 11/19/2022 20:43    Procedures Procedures   Medications Ordered in ED Medications - No data to display  ED Course/ Medical Decision Making/ A&P                           Medical Decision Making Amount and/or Complexity of Data Reviewed Labs: ordered. Decision-making details documented in ED Course. Radiology: ordered. Decision-making details documented in ED Course. ECG/medicine tests: ordered. Decision-making details documented in ED Course.   Medical Decision Making:   [name] [name] is a 37  y.o. female who presented to the ED today with headache, bradycardia detailed above.    Additional history discussed with patient's family/caregivers.  Patient placed on continuous vitals and telemetry monitoring while in ED which was reviewed periodically.  Complete initial physical exam performed, notably the patient  was in no acute distress. She had a mild bradycardia in the upper 50s to lower 60s with a regular rhythm, no murmur/rub/gallop. Lungs clear to auscultation. Abdomen soft, nontender, no palpable masses. 5/5 strength to bilateral upper and lower extremities, cranial nerves intact, normal speech. Neurologically intact.   Reviewed and confirmed nursing documentation for past medical history, family history, social history.    Initial Assessment:   With the patient's presentation of headache, bradycardia, differential diagnosis includes but is not limited to arrhythmia, heart block, electrolyte disturbance, infectious etiology, tension headache, complicated migraine, CVA, TIA, ICH, ACS, viral syndrome.  This is most consistent with an acute complicated illness  Initial Plan:  Screening labs including CBC and Metabolic panel to evaluate for infectious or metabolic etiology of disease.  CT brain to evaluate for  intracranial pathology EKG to evaluate for cardiac pathology Offered headache treatment - pt declined Objective evaluation as reviewed   Initial Study Results:   Laboratory  All laboratory results reviewed without evidence of clinically relevant pathology.    EKG EKG was reviewed independently. ST segments without concerns for elevations.   EKG: sinus bradycardia with sinus arrhythmia.    Radiology:  All images reviewed independently. Agree with radiology report at this time.   CT Head Wo Contrast  Result Date: 11/19/2022 CLINICAL DATA:  Syncope/presyncope, cerebrovascular cause suspected EXAM: CT HEAD WITHOUT CONTRAST TECHNIQUE: Contiguous axial images were obtained from the base of the skull through the vertex without intravenous contrast. RADIATION DOSE REDUCTION: This exam was performed according to the departmental dose-optimization program which includes automated exposure control, adjustment of the mA and/or kV according to patient size and/or use of iterative reconstruction technique. COMPARISON:  None Available. FINDINGS: Brain: No evidence of large-territorial acute infarction. No parenchymal hemorrhage. No mass lesion. No extra-axial collection. No mass effect or midline shift. No hydrocephalus. Basilar cisterns are patent. Vascular: No hyperdense vessel. Skull: No acute fracture or focal lesion. Sinuses/Orbits: Almost complete opacification of bilateral maxillary sinuses. Otherwise paranasal sinuses and mastoid air cells are clear. The orbits are unremarkable. Other: None. IMPRESSION: 1. No acute intracranial abnormality. 2. Bilateral maxillary sinus disease. Electronically Signed   By: Iven Finn M.D.   On: 11/19/2022 20:43     Final Assessment and Plan:   This is a 37 year old female who presents to ED for evaluation of episode of headache, neck pain today. Pt notes at church event she began to feel ill and went to restroom where she felt severe pain in her neck and head that  lasted a few minutes. Still with lingering, mild headache but much improved following Tylenol taken before arrival. Neck pain has resolved. Pt reports she is feeling generally otherwise well but came to ED at prompting of family member for evaluation as her mom noticed her heart rate was in the 55s. Pt denies chest pain, syncope, diaphoresis, or personal or family history of CAD, arrhythmia, or sudden death. Unaware of baseline heart rate. Does state she is very active at baseline. On exam, she has mild bradycardia in the high 50s typically ranging from 55-65. No acute distress. Heart sounds normal. Lungs clear to auscultation. Nonfocal neurologic exam. Otherwise normal exam. Blood pressure normal. On review  or previous EKGs and vitals, heart rate is slower than previous. 2 EKGs completed, one with mild sinus bradycardia and sinus arrhythmia, the second with normal sinus rhythm with sinus arrhythmia. No other significant EKG changes. Other than headache pt currently asymptomatic and in no acute distress. No significant findings on lab results. CT brain with no acute abnormality. Think this is likely normal variant for patient and she has remained stable throughout ED stay. Discussed all findings in detail with pt including recommendations for close outpatient primary care and/or cardiology follow up. Aware that she may benefit from further testing in outpatient setting such as Holter monitoring, cardiology evaluation, etc. Pt expressed understanding of this and agreeable to follow up. Strict ED return precautions given, all questions answered, and stable for discharge.    Clinical Impression:  1. Bradycardia   2. Acute nonintractable headache, unspecified headache type   3. Near syncope      Discharge    Final Clinical Impression(s) / ED Diagnoses Final diagnoses:  Bradycardia  Acute nonintractable headache, unspecified headache type  Near syncope    Rx / DC Orders ED Discharge Orders     None          Turner Daniels 11/21/22 1844    Gareth Morgan, MD 11/23/22 1251
